# Patient Record
Sex: Male | Born: 1960 | Race: White | Hispanic: No | Marital: Single | Smoking: Current every day smoker
Health system: Southern US, Community
[De-identification: ages and names within clinical notes are randomized; demographics above are authoritative.]

## PROBLEM LIST (undated history)

## (undated) DIAGNOSIS — M1990 Unspecified osteoarthritis, unspecified site: Secondary | ICD-10-CM

## (undated) DIAGNOSIS — J449 Chronic obstructive pulmonary disease, unspecified: Secondary | ICD-10-CM

---

## 2018-08-02 ENCOUNTER — Encounter (HOSPITAL_COMMUNITY): Payer: Self-pay | Admitting: Emergency Medicine

## 2018-08-02 ENCOUNTER — Inpatient Hospital Stay (HOSPITAL_COMMUNITY)
Admission: EM | Admit: 2018-08-02 | Discharge: 2018-08-04 | DRG: 192 | Disposition: A | Discharge status: Home / Self Care | Payer: Medicare Other | Attending: Internal Medicine | Admitting: Internal Medicine

## 2018-08-02 ENCOUNTER — Other Ambulatory Visit: Payer: Self-pay

## 2018-08-02 ENCOUNTER — Emergency Department (HOSPITAL_COMMUNITY): Payer: Medicare Other

## 2018-08-02 DIAGNOSIS — Z8249 Family history of ischemic heart disease and other diseases of the circulatory system: Secondary | ICD-10-CM | POA: Diagnosis not present

## 2018-08-02 DIAGNOSIS — K219 Gastro-esophageal reflux disease without esophagitis: Secondary | ICD-10-CM | POA: Diagnosis present

## 2018-08-02 DIAGNOSIS — J440 Chronic obstructive pulmonary disease with acute lower respiratory infection: Secondary | ICD-10-CM | POA: Diagnosis present

## 2018-08-02 DIAGNOSIS — J209 Acute bronchitis, unspecified: Secondary | ICD-10-CM | POA: Diagnosis present

## 2018-08-02 DIAGNOSIS — J441 Chronic obstructive pulmonary disease with (acute) exacerbation: Principal | ICD-10-CM

## 2018-08-02 DIAGNOSIS — F17210 Nicotine dependence, cigarettes, uncomplicated: Secondary | ICD-10-CM | POA: Diagnosis present

## 2018-08-02 DIAGNOSIS — R0602 Shortness of breath: Secondary | ICD-10-CM | POA: Diagnosis present

## 2018-08-02 DIAGNOSIS — Z20828 Contact with and (suspected) exposure to other viral communicable diseases: Secondary | ICD-10-CM | POA: Diagnosis present

## 2018-08-02 DIAGNOSIS — R0601 Orthopnea: Secondary | ICD-10-CM | POA: Diagnosis present

## 2018-08-02 HISTORY — DX: Chronic obstructive pulmonary disease, unspecified: J44.9

## 2018-08-02 HISTORY — DX: Unspecified osteoarthritis, unspecified site: M19.90

## 2018-08-02 LAB — SARS CORONAVIRUS 2 BY RT PCR (HOSPITAL ORDER, PERFORMED IN ~~LOC~~ HOSPITAL LAB): SARS Coronavirus 2: NEGATIVE

## 2018-08-02 LAB — CREATININE, SERUM
Creatinine, Ser: 0.99 mg/dL (ref 0.61–1.24)
GFR calc Af Amer: >60 mL/min (ref >60)
GFR calc non Af Amer: >60 mL/min (ref >60)

## 2018-08-02 LAB — COMPREHENSIVE METABOLIC PANEL
ALT: 16 U/L (ref 0–44)
AST: 23 U/L (ref 15–41)
Albumin: 4.2 g/dL (ref 3.5–5.0)
Alkaline Phosphatase: 54 U/L (ref 38–126)
Anion gap: 11 (ref 5–15)
BUN: 18 mg/dL (ref 6–20)
CO2: 25 mmol/L (ref 22–32)
Calcium: 9.1 mg/dL (ref 8.9–10.3)
Chloride: 101 mmol/L (ref 98–111)
Creatinine, Ser: 1.15 mg/dL (ref 0.61–1.24)
GFR calc Af Amer: >60 mL/min (ref >60)
GFR calc non Af Amer: >60 mL/min (ref >60)
Glucose, Bld: 97 mg/dL (ref 70–99)
Potassium: 4.1 mmol/L (ref 3.5–5.1)
Sodium: 137 mmol/L (ref 135–145)
Total Bilirubin: 0.6 mg/dL (ref 0.3–1.2)
Total Protein: 6.7 g/dL (ref 6.5–8.1)

## 2018-08-02 LAB — CBC
HCT: 45.2 % (ref 39.0–52.0)
Hemoglobin: 15.3 g/dL (ref 13.0–17.0)
MCH: 31.4 pg (ref 26.0–34.0)
MCHC: 33.8 g/dL (ref 30.0–36.0)
MCV: 92.6 fL (ref 80.0–100.0)
Platelets: 239 10*3/uL (ref 150–400)
RBC: 4.88 MIL/uL (ref 4.22–5.81)
RDW: 12.8 % (ref 11.5–15.5)
WBC: 8.6 10*3/uL (ref 4.0–10.5)
nRBC: 0 % (ref 0.0–0.2)

## 2018-08-02 LAB — CBC WITH DIFFERENTIAL/PLATELET
Abs Immature Granulocytes: 0.05 10*3/uL (ref 0.00–0.07)
Basophils Absolute: 0.1 10*3/uL (ref 0.0–0.1)
Basophils Relative: 1 %
Eosinophils Absolute: 0.8 10*3/uL — ABNORMAL HIGH (ref 0.0–0.5)
Eosinophils Relative: 7 %
HCT: 48.8 % (ref 39.0–52.0)
Hemoglobin: 16.1 g/dL (ref 13.0–17.0)
Immature Granulocytes: 0 %
Lymphocytes Relative: 20 %
Lymphs Abs: 2.4 10*3/uL (ref 0.7–4.0)
MCH: 31.3 pg (ref 26.0–34.0)
MCHC: 33 g/dL (ref 30.0–36.0)
MCV: 94.9 fL (ref 80.0–100.0)
Monocytes Absolute: 0.9 10*3/uL (ref 0.1–1.0)
Monocytes Relative: 7 %
Neutro Abs: 7.7 10*3/uL (ref 1.7–7.7)
Neutrophils Relative %: 65 %
Platelets: 260 10*3/uL (ref 150–400)
RBC: 5.14 MIL/uL (ref 4.22–5.81)
RDW: 12.6 % (ref 11.5–15.5)
WBC: 12 10*3/uL — ABNORMAL HIGH (ref 4.0–10.5)
nRBC: 0 % (ref 0.0–0.2)

## 2018-08-02 LAB — D-DIMER, QUANTITATIVE: D-Dimer, Quant: <0.27 ug/mL-FEU (ref 0.00–0.50)

## 2018-08-02 LAB — C-REACTIVE PROTEIN: CRP: <0.8 mg/dL (ref <1.0)

## 2018-08-02 LAB — LACTATE DEHYDROGENASE: LDH: 198 U/L — ABNORMAL HIGH (ref 98–192)

## 2018-08-02 LAB — ABO/RH: ABO/RH(D): A POS

## 2018-08-02 LAB — HIV ANTIBODY (ROUTINE TESTING W REFLEX)
HIV Screen 4th Generation wRfx: NONREACTIVE
HIV Screen 4th Generation wRfx: NONREACTIVE

## 2018-08-02 LAB — FIBRINOGEN: Fibrinogen: 414 mg/dL (ref 210–475)

## 2018-08-02 MED ORDER — ENOXAPARIN SODIUM 40 MG/0.4ML ~~LOC~~ SOLN
40.0000 mg | Freq: Every day | SUBCUTANEOUS | Status: DC
  Administered 2018-08-02: 40 mg via SUBCUTANEOUS
  Filled 2018-08-02 (×3): qty 0.4

## 2018-08-02 MED ORDER — MAGNESIUM SULFATE 2 GM/50ML IV SOLN
2.0000 g | INTRAVENOUS | Status: AC
  Administered 2018-08-02: 03:00:00 2 g via INTRAVENOUS
  Filled 2018-08-02: qty 50

## 2018-08-02 MED ORDER — KETOROLAC TROMETHAMINE 30 MG/ML IJ SOLN
30.0000 mg | Freq: Once | INTRAMUSCULAR | Status: AC
  Administered 2018-08-02: 30 mg via INTRAVENOUS
  Filled 2018-08-02: qty 1

## 2018-08-02 MED ORDER — BENZONATATE 100 MG PO CAPS
100.0000 mg | ORAL_CAPSULE | Freq: Three times a day (TID) | ORAL | Status: DC
  Administered 2018-08-02 – 2018-08-04 (×7): 100 mg via ORAL
  Filled 2018-08-02 (×7): qty 1

## 2018-08-02 MED ORDER — IPRATROPIUM-ALBUTEROL 0.5-2.5 (3) MG/3ML IN SOLN
3.0000 mL | Freq: Four times a day (QID) | RESPIRATORY_TRACT | Status: DC
  Administered 2018-08-02 – 2018-08-03 (×6): 3 mL via RESPIRATORY_TRACT
  Filled 2018-08-02 (×6): qty 3

## 2018-08-02 MED ORDER — METHYLPREDNISOLONE SODIUM SUCC 125 MG IJ SOLR
INTRAMUSCULAR | Status: AC
  Filled 2018-08-02: qty 2

## 2018-08-02 MED ORDER — ALBUTEROL SULFATE (2.5 MG/3ML) 0.083% IN NEBU: 2.5000 mg | INHALATION_SOLUTION | RESPIRATORY_TRACT | Status: DC

## 2018-08-02 MED ORDER — AEROCHAMBER PLUS FLO-VU LARGE MISC
Status: AC
  Administered 2018-08-02: 02:00:00 1
  Filled 2018-08-02: qty 1

## 2018-08-02 MED ORDER — DM-GUAIFENESIN ER 30-600 MG PO TB12
1.0000 | ORAL_TABLET | Freq: Two times a day (BID) | ORAL | Status: DC
  Administered 2018-08-02 – 2018-08-04 (×5): 1 via ORAL
  Filled 2018-08-02 (×5): qty 1

## 2018-08-02 MED ORDER — ALBUTEROL SULFATE HFA 108 (90 BASE) MCG/ACT IN AERS
8.0000 | INHALATION_SPRAY | Freq: Once | RESPIRATORY_TRACT | Status: AC
  Administered 2018-08-02: 8 via RESPIRATORY_TRACT

## 2018-08-02 MED ORDER — IPRATROPIUM-ALBUTEROL 0.5-2.5 (3) MG/3ML IN SOLN
3.0000 mL | RESPIRATORY_TRACT | Status: DC
  Administered 2018-08-02: 3 mL via RESPIRATORY_TRACT
  Filled 2018-08-02: qty 3

## 2018-08-02 MED ORDER — METHYLPREDNISOLONE SODIUM SUCC 125 MG IJ SOLR
60.0000 mg | Freq: Three times a day (TID) | INTRAMUSCULAR | Status: DC
  Administered 2018-08-02 – 2018-08-04 (×6): 60 mg via INTRAVENOUS
  Filled 2018-08-02 (×6): qty 2

## 2018-08-02 MED ORDER — DOXYCYCLINE HYCLATE 100 MG PO TABS
100.0000 mg | ORAL_TABLET | Freq: Two times a day (BID) | ORAL | Status: DC
  Administered 2018-08-02 – 2018-08-04 (×5): 100 mg via ORAL
  Filled 2018-08-02 (×5): qty 1

## 2018-08-02 MED ORDER — ORAL CARE MOUTH RINSE
15.0000 mL | Freq: Two times a day (BID) | OROMUCOSAL | Status: DC
  Administered 2018-08-02 – 2018-08-04 (×5): 15 mL via OROMUCOSAL

## 2018-08-02 MED ORDER — METHYLPREDNISOLONE SODIUM SUCC 125 MG IJ SOLR
125.0000 mg | Freq: Once | INTRAMUSCULAR | Status: AC
  Administered 2018-08-02: 03:00:00 125 mg via INTRAVENOUS
  Filled 2018-08-02: qty 2

## 2018-08-02 MED ORDER — AEROCHAMBER PLUS FLO-VU LARGE MISC
1.0000 | Freq: Once | Status: AC
  Administered 2018-08-02: 1

## 2018-08-02 MED ORDER — ALBUTEROL SULFATE HFA 108 (90 BASE) MCG/ACT IN AERS
8.0000 | INHALATION_SPRAY | Freq: Once | RESPIRATORY_TRACT | Status: AC
  Administered 2018-08-02: 8 via RESPIRATORY_TRACT
  Filled 2018-08-02: qty 6.7

## 2018-08-02 MED ORDER — ALBUTEROL SULFATE HFA 108 (90 BASE) MCG/ACT IN AERS
2.0000 | INHALATION_SPRAY | RESPIRATORY_TRACT | Status: DC
  Administered 2018-08-02: 05:00:00 2 via RESPIRATORY_TRACT
  Filled 2018-08-02: qty 6.7

## 2018-08-02 MED ORDER — SODIUM CHLORIDE 0.9% FLUSH
3.0000 mL | Freq: Two times a day (BID) | INTRAVENOUS | Status: DC
  Administered 2018-08-02 – 2018-08-03 (×5): 3 mL via INTRAVENOUS

## 2018-08-02 MED ORDER — GUAIFENESIN ER 600 MG PO TB12: 600.0000 mg | ORAL_TABLET | Freq: Two times a day (BID) | ORAL | Status: DC

## 2018-08-02 MED ORDER — NICOTINE 14 MG/24HR TD PT24
14.0000 mg | MEDICATED_PATCH | Freq: Every day | TRANSDERMAL | Status: DC
  Administered 2018-08-02 – 2018-08-04 (×3): 14 mg via TRANSDERMAL
  Filled 2018-08-02 (×3): qty 1

## 2018-08-02 MED ORDER — IPRATROPIUM BROMIDE 0.02 % IN SOLN: 0.5000 mg | RESPIRATORY_TRACT | Status: DC

## 2018-08-02 NOTE — Progress Notes (Signed)
[facility] PLAN OF CARE NOTE Patient: Bill Bishop WOE:[id number]   PCP: Patient, No Pcp Per DOB: 1961/04/05   DOA: 08/02/2018   DOS: 08/02/2018    Patient was admitted by my colleague Dr. Joneen Roach earlier on 08/02/2018. I have reviewed the H&P as well as assessment and plan and agree with the same. Important changes in the plan are listed below.  Plan of care: Principal Problem:   COPD with acute exacerbation (HCC)    Continue nebulizer, addCough suppressants, continue steroids, add doxycycline.  Will likely require oxygen on discharge.  Author: Lynden Oxford, MD [facility] 08/02/2018 7:09 PM   If 7PM-7AM, please contact night-coverage at [email or web address]

## 2018-08-02 NOTE — ED Notes (Signed)
ED TO INPATIENT HANDOFF REPORT  ED Nurse Name and Phone #:  Minus Libertysbeyde "Fredric MareBailey" RN [phone]  S Name/Age/Gender Bill Bishop 58 y.o. male Room/Bed: 016C/016C  Code Status   Code Status: Not on file  Home/SNF/Other Home Patient oriented to: self, place, time and situation Is this baseline? Yes      Chief Complaint Shortness of breath, wheezing  Triage Note Pt with 3 day Hx of SOB progresssively getting worse.  Pt arrives with audible inspiratory and expiratory wheezing. Pt states his albuterol inhaller was not sufficient to stop wheezing.   Allergies No Known Allergies  Level of Care/Admitting Diagnosis ED Disposition    None      B Medical/Surgery History No past medical history on file.    A IV Location/Drains/Wounds Patient Lines/Drains/Airways Status   Active Line/Drains/Airways    Name:   Placement date:   Placement time:   Site:   Days:   Peripheral IV 08/02/18 Right Antecubital   08/02/18    0251    Antecubital   less than 1          Intake/Output Last 24 hours No intake or output data in the 24 hours ending 08/02/18 0443  Labs/Imaging Results for orders placed or performed during the hospital encounter of 08/02/18 (from the past 48 hour(s))  CBC with Differential/Platelet     Status: Abnormal   Collection Time: 08/02/18  1:56 AM  Result Value Ref Range   WBC 12.0 (H) 4.0 - 10.5 K/uL   RBC 5.14 4.22 - 5.81 MIL/uL   Hemoglobin 16.1 13.0 - 17.0 g/dL   HCT 95.248.8 84.139.0 - 32.452.0 %   MCV 94.9 80.0 - 100.0 fL   MCH 31.3 26.0 - 34.0 pg   MCHC 33.0 30.0 - 36.0 g/dL   RDW 40.112.6 02.711.5 - 25.315.5 %   Platelets 260 150 - 400 K/uL   nRBC 0.0 0.0 - 0.2 %   Neutrophils Relative % 65 %   Neutro Abs 7.7 1.7 - 7.7 K/uL   Lymphocytes Relative 20 %   Lymphs Abs 2.4 0.7 - 4.0 K/uL   Monocytes Relative 7 %   Monocytes Absolute 0.9 0.1 - 1.0 K/uL   Eosinophils Relative 7 %   Eosinophils Absolute 0.8 (H) 0.0 - 0.5 K/uL   Basophils Relative 1 %   Basophils Absolute 0.1  0.0 - 0.1 K/uL   Immature Granulocytes 0 %   Abs Immature Granulocytes 0.05 0.00 - 0.07 K/uL    Comment: Performed at [facility], [address] [place], [place] [id number]  Comprehensive metabolic panel     Status: None   Collection Time: 08/02/18  1:56 AM  Result Value Ref Range   Sodium 137 135 - 145 mmol/L   Potassium 4.1 3.5 - 5.1 mmol/L   Chloride 101 98 - 111 mmol/L   CO2 25 22 - 32 mmol/L   Glucose, Bld 97 70 - 99 mg/dL   BUN 18 6 - 20 mg/dL   Creatinine, Ser 3.471.15 0.61 - 1.24 mg/dL   Calcium 9.1 8.9 - 42.510.3 mg/dL   Total Protein 6.7 6.5 - 8.1 g/dL   Albumin 4.2 3.5 - 5.0 g/dL   AST 23 15 - 41 U/L   ALT 16 0 - 44 U/L   Alkaline Phosphatase 54 38 - 126 U/L   Total Bilirubin 0.6 0.3 - 1.2 mg/dL   GFR calc non Af Amer >60 >60 mL/min   GFR calc Af Amer >60 >60 mL/min  Anion gap 11 5 - 15    Comment: Performed at [facility], [address], [place], [place] [address]  Lactate dehydrogenase     Status: Abnormal   Collection Time: 08/02/18  1:56 AM  Result Value Ref Range   LDH 198 (H) 98 - 192 U/L    Comment: Performed at [facility], [address] [place], [place] [address]  D-dimer, quantitative (not at [facility])     Status: None   Collection Time: 08/02/18  1:56 AM  Result Value Ref Range   D-Dimer, Quant <0.27 0.00 - 0.50 ug/mL-FEU    Comment: (NOTE) At the manufacturer cut-off of 0.50 ug/mL FEU, this assay has been documented to exclude PE with a sensitivity and negative predictive value of 97 to 99%.  At this time, this assay has not been approved by the FDA to exclude DVT/VTE. Results should be correlated with clinical presentation. Performed at [facility], [address] [place], [place] [address]   Fibrinogen     Status: None   Collection Time: 08/02/18  1:56 AM  Result Value Ref Range   Fibrinogen 414 210 - 475 mg/dL    Comment: Performed at [facility], [address] [place], [place] [address]  ABO/Rh     Status: None    Collection Time: 08/02/18  2:06 AM  Result Value Ref Range   ABO/RH(D)      A POS Performed at [facility], [address] [place], [place] [address]   SARS Coronavirus 2 [facility] order, Performed in [facility] hospital lab)     Status: None   Collection Time: 08/02/18  2:32 AM  Result Value Ref Range   SARS Coronavirus 2 NEGATIVE NEGATIVE    Comment: (NOTE) If result is NEGATIVE SARS-CoV-2 target nucleic acids are NOT DETECTED. The SARS-CoV-2 RNA is generally detectable in upper and lower  respiratory specimens during the acute phase of infection. The lowest  concentration of SARS-CoV-2 viral copies this assay can detect is 250  copies / mL. A negative result does not preclude SARS-CoV-2 infection  and should not be used as the sole basis for treatment or other  patient management decisions.  A negative result may occur with  improper specimen collection / handling, submission of specimen other  than nasopharyngeal swab, presence of viral mutation(s) within the  areas targeted by this assay, and inadequate number of viral copies  (<250 copies / mL). A negative result must be combined with clinical  observations, patient history, and epidemiological information. If result is POSITIVE SARS-CoV-2 target nucleic acids are DETECTED. The SARS-CoV-2 RNA is generally detectable in upper and lower  respiratory specimens dur ing the acute phase of infection.  Positive  results are indicative of active infection with SARS-CoV-2.  Clinical  correlation with patient history and other diagnostic information is  necessary to determine patient infection status.  Positive results do  not rule out bacterial infection or co-infection with other viruses. If result is PRESUMPTIVE POSTIVE SARS-CoV-2 nucleic acids MAY BE PRESENT.   A presumptive positive result was obtained on the submitted specimen  and confirmed on repeat testing.  While 2019 novel coronavirus  (SARS-CoV-2) nucleic  acids may be present in the submitted sample  additional confirmatory testing may be necessary for epidemiological  and / or clinical management purposes  to differentiate between  SARS-CoV-2 and other Sarbecovirus currently known to infect humans.  If clinically indicated additional testing with  an alternate test  methodology [phone]) is advised. The SARS-CoV-2 RNA is generally  detectable in upper and lower respiratory sp ecimens during the acute  phase of infection. The expected result is Negative. Fact Sheet for Patients:  BoilerBrush.com.cy Fact Sheet for Healthcare Providers: https://pope.com/ This test is not yet approved or cleared by the [place] FDA and has been authorized for detection and/or diagnosis of SARS-CoV-2 by FDA under an Emergency Use Authorization (EUA).  This EUA will remain in effect (meaning this test can be used) for the duration of the COVID-19 declaration under Section 564(b)(1) of the Act, 21 U.S.C. section 360bbb-3(b)(1), unless the authorization is terminated or revoked sooner. Performed at [facility], [address], [place], [place] [address]    Dg Chest Port 1 View  Result Date: 08/02/2018 CLINICAL DATA:  Shortness of breath, cough, wheezing EXAM: PORTABLE CHEST 1 VIEW COMPARISON:  None. FINDINGS: Heart and mediastinal contours are within normal limits. No focal opacities or effusions. No acute bony abnormality. IMPRESSION: No active disease. Electronically Signed   By: Charlett Nose M.D.   On: 08/02/2018 02:20    Pending Labs Unresulted Labs (From admission, onward)    Start     Ordered   08/02/18 0150  C-reactive protein  Once,   R     08/02/18 0150   08/02/18 0148  HIV antibody (Routine Testing)  Once,   R     08/02/18 0150          Vitals/Pain Today's Vitals   08/02/18 0338 08/02/18 0345 08/02/18 0400 08/02/18 0431  BP:  133/72 136/84   Pulse:  70 64   Resp:   (!) 26    Temp:      TempSrc:      SpO2:  96% 95%   Weight:      Height:      PainSc: 8    8     Isolation Precautions Airborne and Contact precautions  Medications Medications  sodium chloride flush (NS) 0.9 % injection 3 mL (has no administration in time range)  albuterol (VENTOLIN HFA) 108 (90 Base) MCG/ACT inhaler 2 puff (has no administration in time range)  albuterol (VENTOLIN HFA) 108 (90 Base) MCG/ACT inhaler 8 puff (8 puffs Inhalation Given 08/02/18 0226)  AeroChamber Plus Flo-Vu Large MISC 1 each (1 each Other Given 08/02/18 0312)  AeroChamber Plus Flo-Vu Large MISC (1 each  Given 08/02/18 0227)  methylPREDNISolone sodium succinate (SOLU-MEDROL) 125 mg/2 mL injection 125 mg (125 mg Intravenous Not Given 08/02/18 0316)  magnesium sulfate IVPB 2 g 50 mL (0 g Intravenous Stopped 08/02/18 0436)  albuterol (VENTOLIN HFA) 108 (90 Base) MCG/ACT inhaler 8 puff (8 puffs Inhalation Given 08/02/18 0307)    Mobility walks Low fall risk   Focused Assessments Pulmonary Assessment Handoff:  Lung sounds: Bilateral Breath Sounds: Expiratory wheezes, Inspiratory wheezes L Breath Sounds: Expiratory wheezes, Inspiratory wheezes R Breath Sounds: Expiratory wheezes, Inspiratory wheezes O2 Device: Room Air        R Recommendations: See Admitting Provider Note  Report given to:   Additional Notes:  Pt. C/o SOB at rest worsening with exertion. Pt. States pain of 8 on scale 0-10 when breathing.  Pt. On 2 L O2 nasal cannula. Expiratory wheezing

## 2018-08-02 NOTE — ED Triage Notes (Signed)
Pt with 3 day Hx of SOB progresssively getting worse.  Pt arrives with audible inspiratory and expiratory wheezing. Pt states his albuterol inhaller was not sufficient to stop wheezing.

## 2018-08-02 NOTE — ED Provider Notes (Signed)
[facility] EMERGENCY DEPARTMENT Provider Note   CSN: [id number] Arrival date & time: 08/02/18  0124    History   Chief Complaint Chief Complaint  Patient presents with  . Shortness of Breath    HPI Bill Bishop is a 58 y.o. male.     Patient presents to the emergency department with a chief complaint of shortness of breath and wheezing and productive cough.  States his symptoms started 3 days ago.  He denies any known exposures to anyone with COVID-19.  He denies any fevers or chills.  He reports a history of COPD.  He has been using his inhaler with no relief.  He denies any chest pain.  Denies any lower extremity swelling.  Denies any additional symptoms.  The history is provided by the patient. No language interpreter was used.    No past medical history on file.  There are no active problems to display for this patient.   History reviewed. No pertinent surgical history.      Home Medications    Prior to Admission medications   Not on File    Family History No family history on file.  Social History Social History   Tobacco Use  . Smoking status: Not on file  Substance Use Topics  . Alcohol use: Not on file  . Drug use: Not on file     Allergies   Patient has no known allergies.   Review of Systems Review of Systems  All other systems reviewed and are negative.    Physical Exam Updated Vital Signs BP (!) 146/86 (BP Location: Right Arm)   Pulse 66   Temp 98.2 F (36.8 C) (Oral)   Resp (!) 21   Ht 5\' 8"  (1.727 m)   Wt 77.1 kg   SpO2 94%   BMI 25.85 kg/m   Physical Exam Vitals signs and nursing note reviewed.  Constitutional:      Appearance: He is well-developed.  HENT:     Head: Normocephalic and atraumatic.  Eyes:     Conjunctiva/sclera: Conjunctivae normal.  Neck:     Musculoskeletal: Neck supple.  Cardiovascular:     Rate and Rhythm: Normal rate and regular rhythm.     Heart sounds: No murmur.   Pulmonary:     Effort: No respiratory distress.     Breath sounds: Wheezing present.     Comments: Diffuse wheezes bilaterally Increased work of breathing Abdominal:     Palpations: Abdomen is soft.     Tenderness: There is no abdominal tenderness.  Skin:    General: Skin is warm and dry.  Neurological:     Mental Status: He is alert.      ED Treatments / Results  Labs (all labs ordered are listed, but only abnormal results are displayed) Labs Reviewed  SARS CORONAVIRUS 2 (HOSPITAL ORDER, PERFORMED IN [facility])  HIV ANTIBODY (ROUTINE TESTING W REFLEX)  CBC WITH DIFFERENTIAL/PLATELET  COMPREHENSIVE METABOLIC PANEL  LACTATE DEHYDROGENASE  D-DIMER, QUANTITATIVE (NOT AT [facility])  C-REACTIVE PROTEIN  FIBRINOGEN  ABO/RH    EKG None  Radiology No results found.  Procedures Procedures (including critical care time) CRITICAL CARE Performed by: Roxy Horseman Multiple albuterol treatments, IV solumedrol, IV mag, respiratory distress  Total critical care time: 44 minutes  Critical care time was exclusive of separately billable procedures and treating other patients.  Critical care was necessary to treat or prevent imminent or life-threatening deterioration.  Critical care was time spent personally by  me on the following activities: development of treatment plan with patient and/or surrogate as well as nursing, discussions with consultants, evaluation of patient's response to treatment, examination of patient, obtaining history from patient or surrogate, ordering and performing treatments and interventions, ordering and review of laboratory studies, ordering and review of radiographic studies, pulse oximetry and re-evaluation of patient's condition.  Medications Ordered in ED Medications  sodium chloride flush (NS) 0.9 % injection 3 mL (has no administration in time range)  albuterol (VENTOLIN HFA) 108 (90 Base) MCG/ACT inhaler 8 puff (has no administration in  time range)  AeroChamber Plus Flo-Vu Large MISC 1 each (has no administration in time range)     Initial Impression / Assessment and Plan / ED Course  I have reviewed the triage vital signs and the nursing notes.  Pertinent labs & imaging results that were available during my care of the patient were reviewed by me and considered in my medical decision making (see chart for details).        Patient with presumed COPD exacerbation.  He has a history of the same.  States that he has been having difficulty breathing and wheezing for the past several days.  He denies any fever or chills.  He states that he has had some productive cough.  He denies any known exposures to anyone with COVID-19.  Chest x-ray is clear.  Show mild leukocytosis.  Patient given 2 separate dose of MDI albuterol equaling 7.2mg  per dose, with little improvement.  Patient still sounds very wheezy.  He ambulates dropping his O2 saturation to 90% while on 2 L oxygen.  He does not wear oxygen at home.  COVID-19 test negative.  Patient seen by discussed with Dr. Manus Gunningancour, who recommends admission.  Appreciate Dr. Joneen Roachrosley for admitting.  Final Clinical Impressions(s) / ED Diagnoses   Final diagnoses:  COPD exacerbation [facility](HCC)    ED Discharge Orders    None       Roxy HorsemanBrowning, Tyse Auriemma, PA-C 08/02/18 0539    Glynn Octaveancour, Stephen, MD 08/02/18 [phone]

## 2018-08-02 NOTE — ED Notes (Signed)
Pt. on 2 L O2 Nasal canula, not on oxygen at home, with increase respiratory rate 25 and auditory wheezing.   While at rest attempted to wean pt. Off of O2. Pt. Weaned to 1 L with SpO2 at 96%. While at rest and on room air pt. SpO2 90% with increased respiratory effort.   Pt. Ambulated in room and down hall pt de-sat 90% on 1 L nasal canula. Increased oxygen to 2 L while ambulating increased SpO2 at 94-95% with increased use of accessory muscles and increased breathing.

## 2018-08-02 NOTE — H&P (Signed)
PCP:  None  Chief Complaint:  Shortness of breath  HPI: This is a 58 year old male who presents with complaint of wheezing, coughing and smothering for the last 3 days. He is hurting in  the lungs with coughing and deep breaths. He denies any fevers or chills. He reports a headache. His coughs productive of a grayish sputum  In  The ER he was r/o for Covid. He has been socially isolating. When ambulated patient sating 90% on 2L.  He smokes 1PPD. Not on oxygen.  History provided by the patient  Review of Systems:  The patient denies anorexia, fever, weight loss,, vision loss, decreased hearing, hoarseness, sob, wheezing, chest pain, syncope, dyspnea on exertion, peripheral edema, balance deficits, hemoptysis, abdominal pain, melena, hematochezia, severe indigestion/heartburn, hematuria, incontinence, genital sores, muscle weakness, suspicious skin lesions, transient blindness, difficulty walking, depression, unusual weight change, abnormal bleeding, enlarged lymph nodes, angioedema, and breast masses.  Past Medical History: None  Past surgical history: None  Medications: Prior to Admission medications   None    Allergies:  No Known Allergies  Social History: Positive for tobacco, 1 pack/day.  Negative for file for, alcohol or illicits drug.  Family History: HTN  Physical Exam: Vitals:   08/02/18 0345 08/02/18 0400 08/02/18 0445 08/02/18 0509  BP: 133/72 136/84  138/90  Pulse: 70 64 70 76  Resp:  (!) 26 (!) 25 15  Temp:      TempSrc:      SpO2: 96% 95% 97% 98%  Weight:      Height:        General:  Alert and oriented times three, well developed and nourished, no acute distress Eyes: PERRLA, pink conjunctiva, no scleral icterus ENT: Moist oral mucosa, neck supple, no thyromegaly Lungs: clear to ascultation, wheeze, no crackles, no use of accessory muscles Cardiovascular: regular rate and rhythm, no regurgitation, no gallops, no murmurs. No carotid bruits, no  JVD Abdomen: soft, positive BS, non-tender, non-distended, no organomegaly, not an acute abdomen GU: not examined Neuro: CN II - XII grossly intact, sensation intact Musculoskeletal: strength 5/5 all extremities, no clubbing, cyanosis or edema Skin: no rash, no subcutaneous crepitation, no decubitus Psych: appropriate patient   Labs on Admission:  Recent Labs    08/02/18 0156  NA 137  K 4.1  CL 101  CO2 25  GLUCOSE 97  BUN 18  CREATININE 1.15  CALCIUM 9.1   Recent Labs    08/02/18 0156  AST 23  ALT 16  ALKPHOS 54  BILITOT 0.6  PROT 6.7  ALBUMIN 4.2   No results for input(s): LIPASE, AMYLASE in the last 72 hours. Recent Labs    08/02/18 0156  WBC 12.0*  NEUTROABS 7.7  HGB 16.1  HCT 48.8  MCV 94.9  PLT 260   No results for input(s): CKTOTAL, CKMB, CKMBINDEX, TROPONINI in the last 72 hours. Invalid input(s): POCBNP Recent Labs    08/02/18 0156  DDIMER <0.27   No results for input(s): HGBA1C in the last 72 hours. No results for input(s): CHOL, HDL, LDLCALC, TRIG, CHOLHDL, LDLDIRECT in the last 72 hours. No results for input(s): TSH, T4TOTAL, T3FREE, THYROIDAB in the last 72 hours.  Invalid input(s): FREET3 No results for input(s): VITAMINB12, FOLATE, FERRITIN, TIBC, IRON, RETICCTPCT in the last 72 hours.  Micro Results: Recent Results (from the past 240 hour(s))  SARS Coronavirus 2 [facility](Hospital order, Performed in [facility] hospital lab)     Status: None   Collection Time: 08/02/18  2:32 AM  Result Value Ref Range Status   SARS Coronavirus 2 NEGATIVE NEGATIVE Final    Comment: (NOTE) If result is NEGATIVE SARS-CoV-2 target nucleic acids are NOT DETECTED. The SARS-CoV-2 RNA is generally detectable in upper and lower  respiratory specimens during the acute phase of infection. The lowest  concentration of SARS-CoV-2 viral copies this assay can detect is 250  copies / mL. A negative result does not preclude SARS-CoV-2 infection  and should not be used as  the sole basis for treatment or other  patient management decisions.  A negative result may occur with  improper specimen collection / handling, submission of specimen other  than nasopharyngeal swab, presence of viral mutation(s) within the  areas targeted by this assay, and inadequate number of viral copies  (<250 copies / mL). A negative result must be combined with clinical  observations, patient history, and epidemiological information. If result is POSITIVE SARS-CoV-2 target nucleic acids are DETECTED. The SARS-CoV-2 RNA is generally detectable in upper and lower  respiratory specimens dur ing the acute phase of infection.  Positive  results are indicative of active infection with SARS-CoV-2.  Clinical  correlation with patient history and other diagnostic information is  necessary to determine patient infection status.  Positive results do  not rule out bacterial infection or co-infection with other viruses. If result is PRESUMPTIVE POSTIVE SARS-CoV-2 nucleic acids MAY BE PRESENT.   A presumptive positive result was obtained on the submitted specimen  and confirmed on repeat testing.  While 2019 novel coronavirus  (SARS-CoV-2) nucleic acids may be present in the submitted sample  additional confirmatory testing may be necessary for epidemiological  and / or clinical management purposes  to differentiate between  SARS-CoV-2 and other Sarbecovirus currently known to infect humans.  If clinically indicated additional testing with an alternate test  methodology [phone]) is advised. The SARS-CoV-2 RNA is generally  detectable in upper and lower respiratory sp ecimens during the acute  phase of infection. The expected result is Negative. Fact Sheet for Patients:  BoilerBrush.com.cy Fact Sheet for Healthcare Providers: https://pope.com/ This test is not yet approved or cleared by the [place] FDA and has been authorized for  detection and/or diagnosis of SARS-CoV-2 by FDA under an Emergency Use Authorization (EUA).  This EUA will remain in effect (meaning this test can be used) for the duration of the COVID-19 declaration under Section 564(b)(1) of the Act, 21 U.S.C. section 360bbb-3(b)(1), unless the authorization is terminated or revoked sooner. Performed at [facility], [address], [place], [place] [address]      Radiological Exams on Admission: Dg Chest Port 1 View  Result Date: 08/02/2018 CLINICAL DATA:  Shortness of breath, cough, wheezing EXAM: PORTABLE CHEST 1 VIEW COMPARISON:  None. FINDINGS: Heart and mediastinal contours are within normal limits. No focal opacities or effusions. No acute bony abnormality. IMPRESSION: No active disease. Electronically Signed   By: Charlett Nose M.D.   On: 08/02/2018 02:20    Assessment/Plan Present on Admission: . COPD with acute exacerbation (HCC) -Admit to MedSurg -Solu-Medrol 60 mg IV every 8 -Oxygen to keep sats greater than 88% -Respiratory to evaluate and treat -Patient is COVID negative  Tobacco use -Nicotine patch  Frances Ambrosino 08/02/2018, 5:27 AM

## 2018-08-03 ENCOUNTER — Encounter (HOSPITAL_COMMUNITY): Payer: Self-pay | Admitting: *Deleted

## 2018-08-03 MED ORDER — FAMOTIDINE 20 MG PO TABS
20.0000 mg | ORAL_TABLET | Freq: Every day | ORAL | Status: DC
  Administered 2018-08-03 – 2018-08-04 (×2): 20 mg via ORAL
  Filled 2018-08-03 (×2): qty 1

## 2018-08-03 MED ORDER — IPRATROPIUM-ALBUTEROL 0.5-2.5 (3) MG/3ML IN SOLN
3.0000 mL | Freq: Three times a day (TID) | RESPIRATORY_TRACT | Status: DC
  Administered 2018-08-03 – 2018-08-04 (×3): 3 mL via RESPIRATORY_TRACT
  Filled 2018-08-03 (×3): qty 3

## 2018-08-03 MED ORDER — ALUM & MAG HYDROXIDE-SIMETH 200-200-20 MG/5ML PO SUSP: 15.0000 mL | ORAL | Status: DC | PRN

## 2018-08-03 MED ORDER — TRAMADOL HCL 50 MG PO TABS
50.0000 mg | ORAL_TABLET | Freq: Once | ORAL | Status: AC
  Administered 2018-08-03: 50 mg via ORAL
  Filled 2018-08-03: qty 1

## 2018-08-03 NOTE — Progress Notes (Signed)
   08/02/18 2024  Provider Notification  Provider Name/Title Blount NP  Date Provider Notified 08/02/18  Time Provider Notified 2024  Notification Type Page  Notification Reason Requested by patient/family (c/o achiness/soreness rt chest 2nd coughing)  Response See new orders  Date of Provider Response 08/02/18   Patient c/o achiness/soreness to right chest and back secondary to excessive coughing.  [facility] made aware.  Order received for IV Toradol which was given at 2151.  U[pon reassessment, pain is now 3/10 versus a 7/10.  Will continue to monitor patient.  Bernie Covey RN

## 2018-08-03 NOTE — Progress Notes (Signed)
[facility] Progress Note  Patient: Bill Bishop ZOX:[id number]   PCP: Patient, No Pcp Per DOB: 03/23/1961   DOA: 08/02/2018   DOS: 08/03/2018   Date of Service: the patient was seen and examined on 08/03/2018  Brief hospital course: Pt. with PMH of COPD; admitted on 08/02/2018, presented with complaint of cough and shortness of breath, was found to have COPD exacerbation. Currently further plan is continue current care.  Subjective: Continues to have coughing but there is no shortness of breath.  Unable to sleep last night.  No nausea no vomiting.  Concerned that his oxygenation drops at night.  Assessment and Plan: 1.  COPD exacerbation. Secondary to acute bronchitis. Continue antibiotics continue steroids, continue duo nebs. Continue supportive measures as well. Home oxygen evaluation suggested the patient is able to maintain adequate saturation on room air. Should be able to go home tomorrow if his wheezing continues to improve.  2.  Reported orthopnea without PND. Headaches. Suspect this could be seen in sleep apnea. We will check nocturnal oximetry. Outpatient sleep study recommended.  3.  GERD. Pepcid added.  4.  Active smoker. Recommended to stop smoking. Nicotine patch.  Diet: Regular diet DVT Prophylaxis: subcutaneous Heparin  Advance goals of care discussion: Full code  Family Communication: no family was present at bedside, at the time of interview.   Disposition:  Discharge to home tomorrow.  Consultants: None Procedures: None  Scheduled Meds: . benzonatate  100 mg Oral TID  . dextromethorphan-guaiFENesin  1 tablet Oral BID  . doxycycline  100 mg Oral Q12H  . enoxaparin (LOVENOX) injection  40 mg Subcutaneous Daily  . famotidine  20 mg Oral Daily  . ipratropium-albuterol  3 mL Nebulization TID  . mouth rinse  15 mL Mouth Rinse BID  . methylPREDNISolone (SOLU-MEDROL) injection  60 mg Intravenous Q8H  . nicotine  14 mg Transdermal Daily  . sodium  chloride flush  3 mL Intravenous Q12H   Continuous Infusions: PRN Meds: alum & mag hydroxide-simeth Antibiotics: Anti-infectives (From admission, onward)   Start     Dose/Rate Route Frequency Ordered Stop   08/02/18 1000  doxycycline (VIBRA-TABS) tablet 100 mg     100 mg Oral Every 12 hours 08/02/18 0930         Objective: Physical Exam: Vitals:   08/03/18 0942 08/03/18 1120 08/03/18 1511 08/03/18 1815  BP: 105/68   136/76  Pulse: 77  72 73  Resp: 16 16 16 18   Temp: 98 F (36.7 C)   98 F (36.7 C)  TempSrc: Oral   Oral  SpO2: 97%  94% 95%  Weight:      Height:        Intake/Output Summary (Last 24 hours) at 08/03/2018 2013 Last data filed at 08/03/2018 1817 Gross per 24 hour  Intake 2820 ml  Output 0 ml  Net 2820 ml   Filed Weights   08/02/18 0144 08/02/18 0630 08/02/18 2058  Weight: 77.1 kg 77.5 kg 77.5 kg   General: Alert, Awake and Oriented to Time, Place and Person. Appear in mild distress, affect appropriate Eyes: PERRL, Conjunctiva normal ENT: Oral Mucosa clear moist. Neck: no JVD, no Abnormal Mass Or lumps Cardiovascular: S1 and S2 Present, no Murmur, Peripheral Pulses Present Respiratory: normal respiratory effort, Bilateral Air entry equal and Decreased, no use of accessory muscle, no Crackles, bilateral wheezes Abdomen: Bowel Sound present, Soft and no tenderness, no hernia Skin: no redness, no Rash, no induration Extremities: no Pedal edema, no calf tenderness Neurologic:  Grossly no focal neuro deficit. Bilaterally Equal motor strength  Data Reviewed: CBC: Recent Labs  Lab 08/02/18 0156 08/02/18 0709  WBC 12.0* 8.6  NEUTROABS 7.7  --   HGB 16.1 15.3  HCT 48.8 45.2  MCV 94.9 92.6  PLT 260 239   Basic Metabolic Panel: Recent Labs  Lab 08/02/18 0156 08/02/18 0709  NA 137  --   K 4.1  --   CL 101  --   CO2 25  --   GLUCOSE 97  --   BUN 18  --   CREATININE 1.15 0.99  CALCIUM 9.1  --     Liver Function Tests: Recent Labs  Lab 08/02/18  0156  AST 23  ALT 16  ALKPHOS 54  BILITOT 0.6  PROT 6.7  ALBUMIN 4.2   No results for input(s): LIPASE, AMYLASE in the last 168 hours. No results for input(s): AMMONIA in the last 168 hours. Coagulation Profile: No results for input(s): INR, PROTIME in the last 168 hours. Cardiac Enzymes: No results for input(s): CKTOTAL, CKMB, CKMBINDEX, TROPONINI in the last 168 hours. BNP (last 3 results) No results for input(s): PROBNP in the last 8760 hours. CBG: No results for input(s): GLUCAP in the last 168 hours. Studies: No results found.   Time spent: 35 minutes  Author: Lynden Oxford, MD [facility] 08/03/2018 8:13 PM  To reach On-call, see care teams to locate the attending and reach out to them via [email or web address]. If 7PM-7AM, please contact night-coverage If you still have difficulty reaching the attending provider, please page the [facility] (Director on Call) for [facility] on amion for assistance.

## 2018-08-03 NOTE — Progress Notes (Signed)
Pt placed on overnight Pulse ox at 2130.  Pt currently on room air and tolerating well at this time, RT to monitor and assess as needed.

## 2018-08-03 NOTE — Progress Notes (Signed)
SATURATION QUALIFICATIONS: (This note is used to comply with regulatory documentation for home oxygen)  Patient Saturations on Room Air at Rest = 94%  Patient Saturations on Room Air while Ambulating = 91%  Patient Saturations on 0 Liters of oxygen while Ambulating = 91%  Please briefly explain why patient needs home oxygen:Pt does not need O2 as he ambulated in the hall and the lowest O2 saturation was 91 while ambulating on RA. MD Patel aware.   Leonia Reeves, RN

## 2018-08-04 ENCOUNTER — Encounter (HOSPITAL_COMMUNITY): Payer: Self-pay

## 2018-08-04 MED ORDER — BENZONATATE 100 MG PO CAPS: 100.0000 mg | ORAL_CAPSULE | Freq: Three times a day (TID) | ORAL | 0 refills | Status: DC

## 2018-08-04 MED ORDER — PREDNISONE 10 MG PO TABS: ORAL_TABLET | ORAL | 0 refills | Status: DC

## 2018-08-04 MED ORDER — DOXYCYCLINE HYCLATE 100 MG PO TABS: 100.0000 mg | ORAL_TABLET | Freq: Two times a day (BID) | ORAL | 0 refills | Status: AC

## 2018-08-04 MED ORDER — NICOTINE 14 MG/24HR TD PT24: 14.0000 mg | MEDICATED_PATCH | Freq: Every day | TRANSDERMAL | 0 refills | Status: DC

## 2018-08-04 MED ORDER — DM-GUAIFENESIN ER 30-600 MG PO TB12: 1.0000 | ORAL_TABLET | Freq: Two times a day (BID) | ORAL | 0 refills | Status: AC

## 2018-08-04 MED ORDER — ALBUTEROL SULFATE HFA 108 (90 BASE) MCG/ACT IN AERS: 2.0000 | INHALATION_SPRAY | Freq: Four times a day (QID) | RESPIRATORY_TRACT | 0 refills | Status: DC | PRN

## 2018-08-04 MED ORDER — TIOTROPIUM BROMIDE MONOHYDRATE 18 MCG IN CAPS: 18.0000 ug | ORAL_CAPSULE | Freq: Every day | RESPIRATORY_TRACT | 0 refills | Status: DC

## 2018-08-04 MED ORDER — BUDESONIDE-FORMOTEROL FUMARATE 80-4.5 MCG/ACT IN AERO: 2.0000 | INHALATION_SPRAY | Freq: Two times a day (BID) | RESPIRATORY_TRACT | 0 refills | Status: DC

## 2018-08-04 MED ORDER — IPRATROPIUM-ALBUTEROL 0.5-2.5 (3) MG/3ML IN SOLN: 3.0000 mL | Freq: Four times a day (QID) | RESPIRATORY_TRACT | 0 refills | Status: DC | PRN

## 2018-08-04 NOTE — Progress Notes (Signed)
DISCHARGE NOTE HOME Coreon Weinschenk to be discharged Home per MD order. Discussed prescriptions and follow up appointments with the patient. Prescriptions given to patient; medication list explained in detail. Patient verbalized understanding.  Skin clean, dry and intact without evidence of skin break down, no evidence of skin tears noted. IV catheter discontinued intact. Site without signs and symptoms of complications. Dressing and pressure applied. Pt denies pain at the site currently. No complaints noted.  Patient free of lines, drains, and wounds.   An After Visit Summary (AVS) was printed and given to the patient. Patient escorted via wheelchair, and discharged home via private auto.   Leonia Reeves, RN

## 2018-08-04 NOTE — Progress Notes (Signed)
Overnight Pulse Ox study completed and placed in Pt chart.  RT to monitor and assess as needed.

## 2018-08-05 NOTE — Discharge Summary (Signed)
[facility] Discharge Summary   Patient: Bill Bishop VOZ:[id number]   PCP: Patient, No Pcp Per DOB: [birth year]   Date of admission: 08/02/2018   Date of discharge: 08/04/2018     Discharge Diagnoses:   Principal Problem:   COPD with acute exacerbation (HCC)   Admitted From: hpme Disposition:  hpme  Recommendations for Outpatient Follow-up:  1. Please follow up with PCP in 1 week   Follow-up Information    PCP. Schedule an appointment as soon as possible for a visit in 1 week(s).          Diet recommendation: cardiac diet  Activity: The patient is advised to avoid heavy lifting until better.  Discharge Condition: good  Code Status: full code  History of present illness: As per the H and P dictated on admission, "This is a 58 year old male who presents with complaint of wheezing, coughing and smothering for the last 3 days. He is hurting in  the lungs with coughing and deep breaths. He denies any fevers or chills. He reports a headache. His coughs productive of a grayish sputum  In  The ER he was r/o for Covid. He has been socially isolating. When ambulated patient sating 90% on 2L.  He smokes 1PPD. Not on oxygen.  History provided by the patient"  Hospital Course:  Summary of his active problems in the hospital is as following. 1.  COPD exacerbation. Secondary to acute bronchitis. Continue antibiotics continue steroids, continue duo nebs. Continue supportive measures as well. Home oxygen evaluation suggested the patient is able to maintain adequate saturation on room air. Should be able to go home tomorrow if his wheezing continues to improve.  2.  Reported orthopnea without PND. Headaches. Suspect this could be seen in sleep apnea. Normal nocturnal oximetry. Outpatient sleep study recommended.  3.  GERD. Pepcid added.  4.  Active smoker. Recommended to stop smoking. Nicotine patch.  Patient was ambulatory without any assistance. On the day of  the discharge the patient's vitals were stable , and no other acute medical condition were reported by patient. the patient was felt safe to be discharge at home with family.  Consultants: none Procedures: none  DISCHARGE MEDICATION: Allergies as of 08/04/2018   No Known Allergies     Medication List    TAKE these medications   albuterol 108 (90 Base) MCG/ACT inhaler Commonly known as:  VENTOLIN HFA Inhale 2 puffs into the lungs every 6 (six) hours as needed for wheezing or shortness of breath. What changed:  Another medication with the same name was removed. Continue taking this medication, and follow the directions you see here.   benzonatate 100 MG capsule Commonly known as:  TESSALON Take 1 capsule (100 mg total) by mouth 3 (three) times daily.   budesonide-formoterol 80-4.5 MCG/ACT inhaler Commonly known as:  Symbicort Inhale 2 puffs into the lungs 2 (two) times a day.   dextromethorphan-guaiFENesin 30-600 MG 12hr tablet Commonly known as:  MUCINEX DM Take 1 tablet by mouth 2 (two) times daily for 7 days.   doxycycline 100 MG tablet Commonly known as:  VIBRA-TABS Take 1 tablet (100 mg total) by mouth 2 (two) times daily for 2 days.   ipratropium-albuterol 0.5-2.5 (3) MG/3ML Soln Commonly known as:  DUONEB Take 3 mLs by nebulization every 6 (six) hours as needed.   multivitamin with minerals Tabs tablet Take 1 tablet by mouth daily.   nicotine 14 mg/24hr patch Commonly known as:  NICODERM CQ - dosed in mg/24 hours  Place 1 patch (14 mg total) onto the skin daily.   omeprazole 20 MG tablet Commonly known as:  PRILOSEC OTC Take 20 mg by mouth as needed.   predniSONE 10 MG tablet Commonly known as:  DELTASONE Take 40mg  daily for 3days,Take 30mg  daily for 3days,Take 20mg  daily for 3days,Take 10mg  daily for 3days, then stop   tiotropium 18 MCG inhalation capsule Commonly known as:  Spiriva HandiHaler Place 1 capsule (18 mcg total) into inhaler and inhale daily.       No Known Allergies Discharge Instructions    Ambulatory referral to Pulmonology   Complete by:  As directed    Diet - low sodium heart healthy   Complete by:  As directed    Increase activity slowly   Complete by:  As directed      Discharge Exam: Filed Weights   08/02/18 0630 08/02/18 2058 08/03/18 2129  Weight: 77.5 kg 77.5 kg 79.3 kg   Vitals:   08/04/18 0920 08/04/18 0931  BP:  131/75  Pulse:  72  Resp:  18  Temp:  98 F (36.7 C)  SpO2: 96% 93%   General: Appear in no distress, no Rash; Oral Mucosa moist. Cardiovascular: S1 and S2 Present, no Murmur, no JVD Respiratory: Bilateral Air entry present and b  wheezes Abdomen: Bowel Sound present, Soft and no tenderness Extremities: no Pedal edema, no calf tenderness Neurology: Grossly no focal neuro deficit.  The results of significant diagnostics from this hospitalization (including imaging, microbiology, ancillary and laboratory) are listed below for reference.    Significant Diagnostic Studies: Dg Chest Port 1 View  Result Date: 08/02/2018 CLINICAL DATA:  Shortness of breath, cough, wheezing EXAM: PORTABLE CHEST 1 VIEW COMPARISON:  None. FINDINGS: Heart and mediastinal contours are within normal limits. No focal opacities or effusions. No acute bony abnormality. IMPRESSION: No active disease. Electronically Signed   By: Charlett NoseKevin  Dover M.D.   On: 08/02/2018 02:20    Microbiology: Recent Results (from the past 240 hour(s))  SARS Coronavirus 2 [facility](Hospital order, Performed [facility])     Status: None   Collection Time: 08/02/18  2:32 AM  Result Value Ref Range Status   SARS Coronavirus 2 NEGATIVE NEGATIVE Final    Comment: (NOTE) If result is NEGATIVE SARS-CoV-2 target nucleic acids are NOT DETECTED. The SARS-CoV-2 RNA is generally detectable in upper and lower  respiratory specimens during the acute phase of infection. The lowest  concentration of SARS-CoV-2 viral copies this assay can detect is  250  copies / mL. A negative result does not preclude SARS-CoV-2 infection  and should not be used as the sole basis for treatment or other  patient management decisions.  A negative result may occur with  improper specimen collection / handling, submission of specimen other  than nasopharyngeal swab, presence of viral mutation(s) within the  areas targeted by this assay, and inadequate number of viral copies  (<250 copies / mL). A negative result must be combined with clinical  observations, patient history, and epidemiological information. If result is POSITIVE SARS-CoV-2 target nucleic acids are DETECTED. The SARS-CoV-2 RNA is generally detectable in upper and lower  respiratory specimens dur ing the acute phase of infection.  Positive  results are indicative of active infection with SARS-CoV-2.  Clinical  correlation with patient history and other diagnostic information is  necessary to determine patient infection status.  Positive results do  not rule out bacterial infection or co-infection with other viruses. If result is  PRESUMPTIVE POSTIVE SARS-CoV-2 nucleic acids MAY BE PRESENT.   A presumptive positive result was obtained on the submitted specimen  and confirmed on repeat testing.  While 2019 novel coronavirus  (SARS-CoV-2) nucleic acids may be present in the submitted sample  additional confirmatory testing may be necessary for epidemiological  and / or clinical management purposes  to differentiate between  SARS-CoV-2 and other Sarbecovirus currently known to infect humans.  If clinically indicated additional testing with an alternate test  methodology [phone]) is advised. The SARS-CoV-2 RNA is generally  detectable in upper and lower respiratory sp ecimens during the acute  phase of infection. The expected result is Negative. Fact Sheet for Patients:  BoilerBrush.com.cy Fact Sheet for Healthcare Providers:  https://pope.com/ This test is not yet approved or cleared by the [place] FDA and has been authorized for detection and/or diagnosis of SARS-CoV-2 by FDA under an Emergency Use Authorization (EUA).  This EUA will remain in effect (meaning this test can be used) for the duration of the COVID-19 declaration under Section 564(b)(1) of the Act, 21 U.S.C. section 360bbb-3(b)(1), unless the authorization is terminated or revoked sooner. Performed at [facility], [address] [place], [place] [address]      Labs: CBC: Recent Labs  Lab 08/02/18 0156 08/02/18 0709  WBC 12.0* 8.6  NEUTROABS 7.7  --   HGB 16.1 15.3  HCT 48.8 45.2  MCV 94.9 92.6  PLT 260 239   Basic Metabolic Panel: Recent Labs  Lab 08/02/18 0156 08/02/18 0709  NA 137  --   K 4.1  --   CL 101  --   CO2 25  --   GLUCOSE 97  --   BUN 18  --   CREATININE 1.15 0.99  CALCIUM 9.1  --    Liver Function Tests: Recent Labs  Lab 08/02/18 0156  AST 23  ALT 16  ALKPHOS 54  BILITOT 0.6  PROT 6.7  ALBUMIN 4.2   No results for input(s): LIPASE, AMYLASE in the last 168 hours. No results for input(s): AMMONIA in the last 168 hours. Cardiac Enzymes: No results for input(s): CKTOTAL, CKMB, CKMBINDEX, TROPONINI in the last 168 hours. BNP (last 3 results) No results for input(s): BNP in the last 8760 hours. CBG: No results for input(s): GLUCAP in the last 168 hours. Time spent: 35 minutes  Signed:  Lynden Oxford  [facility] 08/04/2018

## 2018-10-01 DIAGNOSIS — J441 Chronic obstructive pulmonary disease with (acute) exacerbation: Secondary | ICD-10-CM

## 2018-10-01 DIAGNOSIS — Z720 Tobacco use: Secondary | ICD-10-CM

## 2018-10-01 DIAGNOSIS — J9601 Acute respiratory failure with hypoxia: Secondary | ICD-10-CM

## 2018-10-01 DIAGNOSIS — M545 Low back pain: Secondary | ICD-10-CM

## 2018-10-01 DIAGNOSIS — J440 Chronic obstructive pulmonary disease with acute lower respiratory infection: Secondary | ICD-10-CM | POA: Diagnosis not present

## 2018-10-01 DIAGNOSIS — K219 Gastro-esophageal reflux disease without esophagitis: Secondary | ICD-10-CM

## 2018-10-02 DIAGNOSIS — J441 Chronic obstructive pulmonary disease with (acute) exacerbation: Secondary | ICD-10-CM | POA: Diagnosis not present

## 2018-10-02 DIAGNOSIS — J9601 Acute respiratory failure with hypoxia: Secondary | ICD-10-CM | POA: Diagnosis not present

## 2018-10-02 DIAGNOSIS — J440 Chronic obstructive pulmonary disease with acute lower respiratory infection: Secondary | ICD-10-CM | POA: Diagnosis not present

## 2018-10-02 DIAGNOSIS — M545 Low back pain: Secondary | ICD-10-CM | POA: Diagnosis not present

## 2018-10-03 DIAGNOSIS — M545 Low back pain: Secondary | ICD-10-CM | POA: Diagnosis not present

## 2018-10-03 DIAGNOSIS — J9601 Acute respiratory failure with hypoxia: Secondary | ICD-10-CM | POA: Diagnosis not present

## 2018-10-03 DIAGNOSIS — J441 Chronic obstructive pulmonary disease with (acute) exacerbation: Secondary | ICD-10-CM | POA: Diagnosis not present

## 2018-10-03 DIAGNOSIS — J440 Chronic obstructive pulmonary disease with acute lower respiratory infection: Secondary | ICD-10-CM | POA: Diagnosis not present

## 2018-10-04 DIAGNOSIS — J441 Chronic obstructive pulmonary disease with (acute) exacerbation: Secondary | ICD-10-CM | POA: Diagnosis not present

## 2018-10-04 DIAGNOSIS — J9601 Acute respiratory failure with hypoxia: Secondary | ICD-10-CM | POA: Diagnosis not present

## 2018-10-04 DIAGNOSIS — M545 Low back pain: Secondary | ICD-10-CM | POA: Diagnosis not present

## 2018-10-04 DIAGNOSIS — J440 Chronic obstructive pulmonary disease with acute lower respiratory infection: Secondary | ICD-10-CM | POA: Diagnosis not present

## 2018-10-05 DIAGNOSIS — J440 Chronic obstructive pulmonary disease with acute lower respiratory infection: Secondary | ICD-10-CM | POA: Diagnosis not present

## 2018-10-05 DIAGNOSIS — J9601 Acute respiratory failure with hypoxia: Secondary | ICD-10-CM | POA: Diagnosis not present

## 2018-10-05 DIAGNOSIS — M545 Low back pain: Secondary | ICD-10-CM | POA: Diagnosis not present

## 2018-10-05 DIAGNOSIS — J441 Chronic obstructive pulmonary disease with (acute) exacerbation: Secondary | ICD-10-CM | POA: Diagnosis not present

## 2018-11-03 DIAGNOSIS — J9601 Acute respiratory failure with hypoxia: Secondary | ICD-10-CM | POA: Diagnosis not present

## 2018-11-03 DIAGNOSIS — K219 Gastro-esophageal reflux disease without esophagitis: Secondary | ICD-10-CM

## 2018-11-03 DIAGNOSIS — R0601 Orthopnea: Secondary | ICD-10-CM | POA: Diagnosis not present

## 2018-11-03 DIAGNOSIS — J441 Chronic obstructive pulmonary disease with (acute) exacerbation: Secondary | ICD-10-CM | POA: Diagnosis not present

## 2018-11-03 DIAGNOSIS — F17210 Nicotine dependence, cigarettes, uncomplicated: Secondary | ICD-10-CM

## 2018-11-03 DIAGNOSIS — J9600 Acute respiratory failure, unspecified whether with hypoxia or hypercapnia: Secondary | ICD-10-CM | POA: Diagnosis not present

## 2018-11-03 DIAGNOSIS — J209 Acute bronchitis, unspecified: Secondary | ICD-10-CM

## 2018-11-03 DIAGNOSIS — J440 Chronic obstructive pulmonary disease with acute lower respiratory infection: Secondary | ICD-10-CM | POA: Diagnosis not present

## 2018-11-04 DIAGNOSIS — J440 Chronic obstructive pulmonary disease with acute lower respiratory infection: Secondary | ICD-10-CM | POA: Diagnosis not present

## 2018-11-04 DIAGNOSIS — J9601 Acute respiratory failure with hypoxia: Secondary | ICD-10-CM | POA: Diagnosis not present

## 2018-11-04 DIAGNOSIS — J441 Chronic obstructive pulmonary disease with (acute) exacerbation: Secondary | ICD-10-CM | POA: Diagnosis not present

## 2018-11-04 DIAGNOSIS — J9600 Acute respiratory failure, unspecified whether with hypoxia or hypercapnia: Secondary | ICD-10-CM | POA: Diagnosis not present

## 2018-11-05 DIAGNOSIS — J440 Chronic obstructive pulmonary disease with acute lower respiratory infection: Secondary | ICD-10-CM | POA: Diagnosis not present

## 2018-11-05 DIAGNOSIS — J9601 Acute respiratory failure with hypoxia: Secondary | ICD-10-CM | POA: Diagnosis not present

## 2018-11-05 DIAGNOSIS — J9600 Acute respiratory failure, unspecified whether with hypoxia or hypercapnia: Secondary | ICD-10-CM | POA: Diagnosis not present

## 2018-11-05 DIAGNOSIS — J441 Chronic obstructive pulmonary disease with (acute) exacerbation: Secondary | ICD-10-CM | POA: Diagnosis not present

## 2019-02-17 ENCOUNTER — Encounter: Payer: Self-pay | Admitting: Pulmonary Disease

## 2019-02-17 ENCOUNTER — Other Ambulatory Visit: Payer: Self-pay

## 2019-02-17 ENCOUNTER — Ambulatory Visit: Payer: BLUE CROSS/BLUE SHIELD | Admitting: Pulmonary Disease

## 2019-02-17 VITALS — BP 170/82 | HR 61 | Ht 67.5 in | Wt 190.4 lb

## 2019-02-17 DIAGNOSIS — J441 Chronic obstructive pulmonary disease with (acute) exacerbation: Secondary | ICD-10-CM | POA: Diagnosis not present

## 2019-02-17 MED ORDER — ALBUTEROL SULFATE HFA 108 (90 BASE) MCG/ACT IN AERS: 2.0000 | INHALATION_SPRAY | Freq: Four times a day (QID) | RESPIRATORY_TRACT | 1 refills | Status: DC | PRN

## 2019-02-17 MED ORDER — TRELEGY ELLIPTA 200-62.5-25 MCG/INH IN AEPB: 1.0000 | INHALATION_SPRAY | Freq: Every day | RESPIRATORY_TRACT | 0 refills | Status: DC

## 2019-02-17 MED ORDER — PREDNISONE 10 MG PO TABS: 30.0000 mg | ORAL_TABLET | Freq: Every day | ORAL | 0 refills | Status: AC

## 2019-02-17 MED ORDER — AZITHROMYCIN 250 MG PO TABS: 250.0000 mg | ORAL_TABLET | Freq: Every day | ORAL | 0 refills | Status: DC

## 2019-02-17 MED ORDER — TRELEGY ELLIPTA 200-62.5-25 MCG/INH IN AEPB: 1.0000 | INHALATION_SPRAY | Freq: Every day | RESPIRATORY_TRACT | 3 refills | Status: DC

## 2019-02-17 NOTE — Progress Notes (Signed)
Subjective:    Patient ID: Bill Bishop, male    DOB: Oct 28, 1960, 58 y.o.   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 Arthritis   . COPD (chronic obstructive pulmonary disease) (HCC)    Social History   Socioeconomic History  . Marital status: Single    Spouse name: Not on file  . Number of children: Not on file  . Years of education: Not on file  . Highest education level: Not on file  Occupational History  . Not on file  Social Needs  . Financial resource strain: Not on file  . Food insecurity    Worry: Not on file    Inability: Not on file  . Transportation needs    Medical: Not on file    Non-medical: Not on file  Tobacco Use  . Smoking status: Current Every Day Smoker    Packs/day: 1.00    Years: 40.00    Pack years: 40.00    Types: Cigarettes  . Smokeless tobacco: Never Used  Substance and Sexual Activity  . Alcohol use: Not on file  . Drug use: Not on file  . Sexual activity: Not on file  Lifestyle  . Physical activity    Days per week: Not on file    Minutes per session: Not on file  . Stress: Not on file  Relationships  . Social Herbalist on phone: Not on file    Gets together: Not on file    Attends religious service: Not on file    Active member of club or organization: Not on file    Attends meetings of clubs or organizations: Not on file    Relationship status: Not on file  . Intimate partner violence    Fear of current or ex partner: Not on file    Emotionally abused: Not on file    Physically abused: Not on file    Forced sexual activity: Not on file  Other Topics Concern  . Not on file  Social History Narrative  . Not on file   No family history on file.   Review of Systems  Constitutional: Negative for fever and unexpected weight change.  HENT: Positive for congestion and sneezing. Negative for dental problem, ear pain, nosebleeds, postnasal drip, rhinorrhea, sinus pressure, sore throat and trouble swallowing.   Eyes: Negative for redness and itching.  Respiratory: Positive for cough, chest tightness, shortness of breath and wheezing.   Cardiovascular: Negative for palpitations and leg swelling.  Gastrointestinal: Negative for nausea and vomiting.  Genitourinary: Negative for dysuria.  Musculoskeletal: Negative for joint swelling.  Skin: Negative for rash.  Allergic/Immunologic: Negative.  Negative for environmental allergies, food allergies and immunocompromised state.  Neurological: Positive for headaches.  Hematological: Does not bruise/bleed easily.  Psychiatric/Behavioral: Negative for dysphoric mood. The patient is nervous/anxious.       Objective:   Physical Exam Constitutional:  Appearance: Normal appearance.  HENT:     Head: Normocephalic.     Nose: Nose normal.     Mouth/Throat:     Mouth: Mucous membranes are moist.     Pharynx: No oropharyngeal exudate.  Neck:     Musculoskeletal: Normal range of motion. No neck rigidity or muscular tenderness.  Cardiovascular:     Rate and Rhythm: Normal rate and regular rhythm.     Heart sounds: No murmur.  Pulmonary:     Effort: Pulmonary effort is normal.     Breath sounds: Normal breath sounds. No stridor. No wheezing.  Musculoskeletal: Normal range of motion.        General: No swelling.  Skin:    General: Skin is warm.     Coloration: Skin is not jaundiced or pale.  Neurological:     General: No focal  deficit present.     Mental Status: He is alert.     Cranial Nerves: No cranial nerve deficit.     Sensory: No sensory deficit.  Psychiatric:        Mood and Affect: Mood normal.    Vitals:   02/17/19 1330  BP: (!) 170/82  Pulse: 61  SpO2: 95%   Chest x-ray from April 2020-hyperinflated lung fields     Assessment & Plan:  .  Severe obstructive lung disease  .  COPD with exacerbation  .  Hypertension -He stated his blood pressure has not been high in the past -This may be spurious -I did let him know that he needs to have this followed  Plan: Azithromycin Prednisone 30 mg daily for 5 days Switch inhaler to Trelegy Albuterol as needed Obtain pulmonary function study  Inhaler technique was reviewed  I will see him back in the office in about 6 weeks Encouraged to call with any significant concerns

## 2019-02-17 NOTE — Addendum Note (Signed)
Addended by: Elton Sin on: 02/17/2019 02:15 PM   Modules accepted: Orders

## 2019-02-17 NOTE — Patient Instructions (Signed)
COPD with exacerbation  We will give you a course of antibiotics Course of steroids  Change your inhaler to Trelegy--once you start using Trelegy to not use Advair that you showed me, stop using Atrovent  Albuterol to be used as needed  We will get a breathing study on you  I will see you back in 6 weeks

## 2019-03-02 ENCOUNTER — Telehealth: Payer: Self-pay | Admitting: Pulmonary Disease

## 2019-03-02 NOTE — Telephone Encounter (Signed)
Medication name and strength: Trelegy 225mcg Provider: AO Pharmacy: [facility] in [place] Patient insurance ID: [id number] Phone:  Fax:   Was the PA started on CMM?  Yes If yes, please enter the Key: [id number] Timeframe for approval/denial: approved   [facility] is aware of approval. Nothing further needed at time of call.

## 2019-03-19 ENCOUNTER — Encounter: Payer: Self-pay | Admitting: Adult Health

## 2019-03-19 ENCOUNTER — Other Ambulatory Visit: Payer: Self-pay

## 2019-03-19 ENCOUNTER — Ambulatory Visit (INDEPENDENT_AMBULATORY_CARE_PROVIDER_SITE_OTHER): Payer: BLUE CROSS/BLUE SHIELD | Admitting: Adult Health

## 2019-03-19 DIAGNOSIS — J441 Chronic obstructive pulmonary disease with (acute) exacerbation: Secondary | ICD-10-CM

## 2019-03-19 MED ORDER — IPRATROPIUM-ALBUTEROL 0.5-2.5 (3) MG/3ML IN SOLN: 3.0000 mL | Freq: Three times a day (TID) | RESPIRATORY_TRACT | 5 refills | Status: DC

## 2019-03-19 MED ORDER — SPIRIVA HANDIHALER 18 MCG IN CAPS: 18.0000 ug | ORAL_CAPSULE | Freq: Every day | RESPIRATORY_TRACT | 5 refills | Status: AC

## 2019-03-19 MED ORDER — BUDESONIDE-FORMOTEROL FUMARATE 80-4.5 MCG/ACT IN AERO: 2.0000 | INHALATION_SPRAY | Freq: Two times a day (BID) | RESPIRATORY_TRACT | 5 refills | Status: DC

## 2019-03-19 MED ORDER — DOXYCYCLINE HYCLATE 100 MG PO TABS: 100.0000 mg | ORAL_TABLET | Freq: Two times a day (BID) | ORAL | 0 refills | Status: DC

## 2019-03-19 MED ORDER — PREDNISONE 10 MG PO TABS: ORAL_TABLET | ORAL | 0 refills | Status: DC

## 2019-03-19 NOTE — Patient Instructions (Addendum)
Doxycycline 100 mg twice daily for 7 days, take with food Prednisone taper over the next week Mucinex DM twice daily as needed for cough and congestion Work on quitting smoking Restart Symbicort 2 puffs twice daily, rinse after use Restart Spiriva daily, rinse after use We have called this into your pharmacy please let us know if these are not affordable Begin DuoNeb nebulizer 3 times daily as needed wheezing/shortness of breath  We are setting you up for a chest x-ray Follow-up with Dr. Hermina Staggers in 6 weeks with PFT and As needed   Please contact office for sooner follow up if symptoms do not improve or worsen or seek emergency care

## 2019-03-19 NOTE — Addendum Note (Signed)
Addended by: Parke Poisson E on: 03/19/2019 04:11 PM   Modules accepted: Orders

## 2019-03-19 NOTE — Progress Notes (Signed)
Virtual Visit via Telephone Note  I connected with Bill Bishop on 03/19/19 at 10:30 AM EST by telephone and verified that I am speaking with the correct person using two identifiers.  Location: Patient: Home  Provider: Office    I discussed the limitations, risks, security and privacy concerns of performing an evaluation and management service by telephone and the availability of in person appointments. I also discussed with the patient that there may be a patient responsible charge related to this service. The patient expressed understanding and agreed to proceed.   History of Present Illness: 58 year old male active smoker seen for pulmonary consult February 17, 2019 for COPD. Previous work in [employer].  On disability.  Today's televisit is for an acute office visit.  Patient complains of 3 to 4 days of productive cough with thick yellow to green mucus.  Increased wheezing and shortness of breath.  Patient says he has been coughing a lot and has some pain along his left lung and rib area.  He denies any fever, chills, body aches, hemoptysis.  No known sick contacts.  He has previously been on multiple inhalers including Advair, Trelegy, Symbicort and Spiriva.  He was given Trelegy last visit.  But we discussed that he has a deductible and was unable to afford this.  It is unclear if he ever picked up Symbicort or Spiriva in the past.  Says that he knows he took the samples and they worked well.  States he has been out of all of his samples for several weeks.  Patient has cut back on smoking.  We discussed smoking cessation. Patient has been set up for pulmonary function testing but this has not been completed as of yet.  Patient is not on oxygen Appetite is good with no nausea vomiting or diarrhea.  No loss of taste or smell.   Patient had similar symptoms to this 1 month ago when he was seen in the office says that he got better with the prednisone but now symptoms are  returning.  Observations/Objective: Speaks in full sentences without audible wheezing  Chest x-ray August 02, 2018 was clear with no acute process  Assessment and Plan: COPD exacerbation-low suspicion for COVID-19 as he has been social distancing and quarantine at home.  He has no red flag symptoms.  This seems to be a continuation of his ongoing COPD symptoms.  Advised if he develops a fever increased respiratory symptoms he is to contact immediately and we can send him for testing He needs PFTs on return. Left-sided pleuritic pain questionable etiology may be secondary to coughing and COPD will get chest x-ray. We will restart Symbicort and Spiriva.  Prescriptions have been sent to the pharmacy have advised him if these are not affordable to contact our office back.  Also will give refills of DuoNeb that he may use.  If unable to afford inhalers we can go with nebulizer for medicines which will be more cost effective. Smoking cessation was discussed  PLAN  Patient Instructions  Doxycycline 100 mg twice daily for 7 days, take with food Prednisone taper over the next week Mucinex DM twice daily as needed for cough and congestion Work on quitting smoking Restart Symbicort 2 puffs twice daily, rinse after use Restart Spiriva daily, rinse after use We have called this into your pharmacy please let us know if these are not affordable Begin DuoNeb nebulizer 3 times daily as needed wheezing/shortness of breath  We are setting you up for a chest  x-ray Follow-up with Dr. Melida Quitter in 6 weeks with PFT and As needed   Please contact office for sooner follow up if symptoms do not improve or worsen or seek emergency care      Consider pharmacy consult on return if medication cost continue to be a underlying issue  Follow Up Instructions: Follow-up in 6 weeks with Dr. Melida Quitter and As needed   Please contact office for sooner follow up if symptoms do not improve or worsen or seek emergency care      I discussed the assessment and treatment plan with the patient. The patient was provided an opportunity to ask questions and all were answered. The patient agreed with the plan and demonstrated an understanding of the instructions.   The patient was advised to call back or seek an in-person evaluation if the symptoms worsen or if the condition fails to improve as anticipated.  I provided  25 minutes of non-face-to-face time during this encounter.   Rubye Oaks, NP

## 2019-03-30 ENCOUNTER — Other Ambulatory Visit: Payer: Self-pay

## 2019-03-30 ENCOUNTER — Ambulatory Visit (INDEPENDENT_AMBULATORY_CARE_PROVIDER_SITE_OTHER): Payer: BLUE CROSS/BLUE SHIELD | Admitting: Pulmonary Disease

## 2019-03-30 ENCOUNTER — Encounter: Payer: Self-pay | Admitting: Pulmonary Disease

## 2019-03-30 VITALS — BP 150/88 | HR 68 | Temp 97.9°F | Ht 67.0 in | Wt 190.6 lb

## 2019-03-30 DIAGNOSIS — R05 Cough: Secondary | ICD-10-CM

## 2019-03-30 DIAGNOSIS — R0602 Shortness of breath: Secondary | ICD-10-CM | POA: Diagnosis not present

## 2019-03-30 DIAGNOSIS — Z23 Encounter for immunization: Secondary | ICD-10-CM

## 2019-03-30 DIAGNOSIS — J441 Chronic obstructive pulmonary disease with (acute) exacerbation: Secondary | ICD-10-CM | POA: Diagnosis not present

## 2019-03-30 DIAGNOSIS — R059 Cough, unspecified: Secondary | ICD-10-CM

## 2019-03-30 MED ORDER — ROFLUMILAST 500 MCG PO TABS: 500.0000 ug | ORAL_TABLET | Freq: Every day | ORAL | 11 refills | Status: DC

## 2019-03-30 MED ORDER — IPRATROPIUM-ALBUTEROL 0.5-2.5 (3) MG/3ML IN SOLN: 3.0000 mL | Freq: Three times a day (TID) | RESPIRATORY_TRACT | 5 refills | Status: DC

## 2019-03-30 NOTE — Patient Instructions (Signed)
COPD  Prescription for DuoNeb Prescription for Daliresp We will get your flu shot today  We will see you in 3 months We will try and get a copy of your breathing study  Call with significant concerns

## 2019-03-30 NOTE — Progress Notes (Signed)
Subjective:    Patient ID: Bill Bishop, male    DOB: [birth year], 58 y.o.   MRN: [id number]  Patient being seen for chronic obstructive pulmonary disease  Recently called in for exacerbation of symptoms Was given a course of prednisone and doxycycline This has helped her symptoms some  He is still smoking but managing to cut down some Exercise tolerance remains about the same Is trying to get more active  Chronic cough with grayish phlegm  He has COPD Has tried multiple inhalers in the past More short of breath recently  Mornings are usually worse with cough and sputum production, feeling smothered  Quit smoking about a month to month any of ago he stated Was smoking about a pack a day  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 Arthritis   . COPD (chronic obstructive pulmonary disease) (HCC)    Social History   Socioeconomic History  . Marital status: Single    Spouse name: Not on file  . Number of children: Not on file  . Years of education: Not on file  . Highest education level: Not on file  Occupational History  . Not on file  Tobacco Use  . Smoking status: Current Every Day Smoker    Packs/day: 1.00    Years: 40.00    Pack years: 40.00    Types: Cigarettes  . Smokeless tobacco: Never Used  Substance and Sexual Activity  . Alcohol use: Not on file  . Drug use: Not on file  . Sexual activity: Not on file  Other Topics Concern  . Not on file  Social History Narrative  . Not on file   Social Determinants of Health   Financial Resource Strain:   . Difficulty of Paying Living Expenses: Not on file  Food Insecurity:   . Worried About Charity fundraiser in the Last Year: Not on file  . Ran Out of Food in the Last Year: Not on file  Transportation Needs:   . Lack of Transportation (Medical): Not on file  . Lack of Transportation  (Non-Medical): Not on file  Physical Activity:   . Days of Exercise per Week: Not on file  . Minutes of Exercise per Session: Not on file  Stress:   . Feeling of Stress : Not on file  Social Connections:   . Frequency of Communication with Friends and Family: Not on file  . Frequency of Social Gatherings with Friends and Family: Not on file  . Attends Religious Services: Not on file  . Active Member of Clubs or Organizations: Not on file  . Attends Archivist Meetings: Not on file  . Marital Status: Not on file  Intimate Partner Violence:   . Fear of Current or Ex-Partner: Not on file  . Emotionally Abused: Not on file  . Physically Abused: Not on file  . Sexually Abused: Not on file   No family history on file.   Review of Systems  Constitutional: Negative for fever and unexpected weight change.  HENT: Positive for congestion and sneezing. Negative for dental problem, ear pain, nosebleeds, postnasal drip, rhinorrhea, sinus pressure, sore throat and trouble swallowing.   Eyes: Negative for redness and itching.  Respiratory: Positive for cough, chest tightness, shortness of breath and wheezing.   Cardiovascular: Negative for palpitations and leg swelling.  Gastrointestinal: Negative for nausea  and vomiting.  Genitourinary: Negative for dysuria.  Musculoskeletal: Negative for joint swelling.  Skin: Negative for rash.  Allergic/Immunologic: Negative.  Negative for environmental allergies, food allergies and immunocompromised state.  Neurological: Positive for headaches.  Hematological: Does not bruise/bleed easily.  Psychiatric/Behavioral: Negative for dysphoric mood. The patient is nervous/anxious.   All other systems reviewed and are negative.     Objective:   Physical Exam Constitutional:      Appearance: Normal appearance.  HENT:     Head: Normocephalic.     Nose: Nose normal.     Mouth/Throat:     Mouth: Mucous membranes are moist.     Pharynx: No  oropharyngeal exudate.  Cardiovascular:     Rate and Rhythm: Normal rate and regular rhythm.     Heart sounds: No murmur.  Pulmonary:     Effort: Pulmonary effort is normal.     Breath sounds: Normal breath sounds. No stridor. No wheezing.  Musculoskeletal:     Cervical back: Normal range of motion. No rigidity. No muscular tenderness.  Neurological:     Mental Status: He is alert.    Vitals:   03/30/19 1342  BP: (!) 150/88  Pulse: 68  Temp: 97.9 F (36.6 C)  SpO2: 95%   Chest x-ray from April 2020-hyperinflated lung fields     Assessment & Plan:  .  Severe obstructive lung disease -Recent exacerbation for which he got a course of antibiotics and steroids -Feeling better  .  COPD with exacerbation -Better with recent treatment  .  Hypertension -Follow-up with primary  Plan: Continue Symbicort, Spiriva DuoNeb nebulization as needed   Inhaler technique was reviewed  I will see him back in the office in about 3 months Encouraged to call with any significant concerns  Add Daliresp Flu shot today  We will call for copy of this PFT

## 2019-04-20 ENCOUNTER — Telehealth: Payer: Self-pay | Admitting: Pulmonary Disease

## 2019-04-20 NOTE — Telephone Encounter (Signed)
Received PA request from [facility] today for patient's Daliresp. Attempted to do PA online but it states patient does not have active coverage with [employer]. Will call the number provided on PA form to initiate PA request.

## 2019-04-28 ENCOUNTER — Telehealth: Payer: Self-pay | Admitting: Pulmonary Disease

## 2019-04-28 NOTE — Telephone Encounter (Signed)
Inform patient PFT reviewed  Severe obstructive disease No change to current treatment  Will follow up in the office as scheduled

## 2019-04-30 NOTE — Telephone Encounter (Signed)
Patient is on recall list for March schedule.

## 2019-06-23 ENCOUNTER — Telehealth: Payer: Self-pay | Admitting: Acute Care

## 2019-06-23 DIAGNOSIS — R059 Cough, unspecified: Secondary | ICD-10-CM

## 2019-06-23 DIAGNOSIS — R05 Cough: Secondary | ICD-10-CM

## 2019-06-23 NOTE — Telephone Encounter (Signed)
Received an after hours call from patient stating that he is coughing up yellow secretions and that he wants an antibiotic called in. He was diagnosed with Covid 05/20/2019. He had vomiting and diarrhea with this. He also had cough and congestion. He had fevers up through 2 weeks ago.Marland Kitchen He was not admitted to the hospital.  He has had worsening of cough,Yellow secretions and  no fever. Triage, please arrange for the patient to be seen 3/18 by NP or MD   with a CXR  prior. He was going to check with family to see what time he could get a ride to the office tomorrow. Please call him early am to get him scheduled. He is outside the quarantine window, so he can come into the office . He does not have fever.Thanks so much

## 2019-06-24 ENCOUNTER — Telehealth: Payer: Self-pay | Admitting: Pulmonary Disease

## 2019-06-24 ENCOUNTER — Other Ambulatory Visit: Payer: Self-pay

## 2019-06-24 ENCOUNTER — Ambulatory Visit (INDEPENDENT_AMBULATORY_CARE_PROVIDER_SITE_OTHER): Payer: BLUE CROSS/BLUE SHIELD

## 2019-06-24 ENCOUNTER — Ambulatory Visit (INDEPENDENT_AMBULATORY_CARE_PROVIDER_SITE_OTHER): Payer: BLUE CROSS/BLUE SHIELD | Admitting: Pulmonary Disease

## 2019-06-24 ENCOUNTER — Encounter: Payer: Self-pay | Admitting: Pulmonary Disease

## 2019-06-24 VITALS — BP 156/84 | HR 93 | Temp 97.1°F | Ht 67.0 in | Wt 180.2 lb

## 2019-06-24 DIAGNOSIS — R05 Cough: Secondary | ICD-10-CM

## 2019-06-24 DIAGNOSIS — R059 Cough, unspecified: Secondary | ICD-10-CM

## 2019-06-24 DIAGNOSIS — J441 Chronic obstructive pulmonary disease with (acute) exacerbation: Secondary | ICD-10-CM | POA: Diagnosis not present

## 2019-06-24 MED ORDER — OXYCODONE-ACETAMINOPHEN 2.5-325 MG PO TABS: 1.0000 | ORAL_TABLET | ORAL | 0 refills | Status: DC | PRN

## 2019-06-24 MED ORDER — LEVOFLOXACIN 750 MG PO TABS: 750.0000 mg | ORAL_TABLET | Freq: Every day | ORAL | 0 refills | Status: DC

## 2019-06-24 MED ORDER — PREDNISONE 10 MG PO TABS: 20.0000 mg | ORAL_TABLET | Freq: Two times a day (BID) | ORAL | 0 refills | Status: DC

## 2019-06-24 NOTE — Telephone Encounter (Signed)
lmtcb for pt. Dr. Wynona Neat has openings, can schedule with AO with CXR prior.

## 2019-06-24 NOTE — Telephone Encounter (Signed)
Patient has requested prescriptions from OV be [facility] of [facility]. Levaquin and Prednisone prescriptions sent to [facility] [place]. LM on VM (DPR), that Levaquin and Prednisone have been changed to requested [facility], but Percocet will have to be sent by Dr. Wynona Neat, and that may be later today. Percocet prescription cancelled at [facility].  Dr. Wynona Neat, can you send a percocet prescription to [facility] [place]  Message routed to Dr. Wynona Neat

## 2019-06-24 NOTE — Addendum Note (Signed)
Addended by: Sheran Luz on: 06/24/2019 09:21 AM   Modules accepted: Orders

## 2019-06-24 NOTE — Telephone Encounter (Signed)
CXR order placed. Will sign off.

## 2019-06-24 NOTE — Patient Instructions (Signed)
COPD exacerbation  Levaquin 750 daily for 5 days Prednisone 20 p.o. twice daily for 5 days  Percocet 1 p.o. q. 4 as needed for pain  Obtain chest x-ray   60-month follow-up

## 2019-06-24 NOTE — Telephone Encounter (Signed)
Pt scheduled for 2:30 today w/cxr to see Dr. Wynona Neat.

## 2019-06-24 NOTE — Progress Notes (Signed)
Subjective:    Patient ID: Bill Bishop, male    DOB: 05-Jun-1960, 59 y.o.   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 Arthritis   . COPD (chronic obstructive pulmonary disease) (HCC)    Social History   Socioeconomic History  . Marital status: Single    Spouse name: Not on file  . Number of children: Not on file  . Years of education: Not on file  . Highest education level: Not on file  Occupational History  . Not on file  Tobacco Use  . Smoking status: Current Every Day Smoker    Packs/day: 0.25    Years: 40.00    Pack years: 10.00    Types: Cigarettes  . Smokeless tobacco: Never Used  . Tobacco comment: smokes 5-6/day  Substance and Sexual Activity  . Alcohol use: Not on file  . Drug use: Not on file  . Sexual activity: Not on file  Other Topics Concern  . Not on file  Social History Narrative  . Not on file   Social Determinants of Health   Financial Resource Strain:   . Difficulty of Paying Living Expenses:   Food Insecurity:   . Worried About Charity fundraiser in the Last Year:   . Arboriculturist in the Last Year:   Transportation Needs:   . Film/video editor (Medical):   Marland Kitchen Lack of Transportation (Non-Medical):   Physical Activity:   . Days of Exercise per Week:   . Minutes of  Exercise per Session:   Stress:   . Feeling of Stress :   Social Connections:   . Frequency of Communication with Friends and Family:   . Frequency of Social Gatherings with Friends and Family:   . Attends Religious Services:   . Active Member of Clubs or Organizations:   . Attends Archivist Meetings:   Marland Kitchen Marital Status:   Intimate Partner Violence:   . Fear of Current or Ex-Partner:   . Emotionally Abused:   Marland Kitchen Physically Abused:   . Sexually Abused:    History reviewed. No pertinent family history.   Review of Systems  Constitutional: Negative for fever and unexpected weight change.  HENT: Positive for congestion and sneezing. Negative for dental problem, ear pain, nosebleeds, postnasal drip, rhinorrhea, sinus pressure, sore throat and trouble swallowing.   Eyes: Negative for redness and itching.  Respiratory: Positive for cough, chest tightness, shortness of breath and wheezing.   Cardiovascular: Negative for palpitations and leg swelling.  Gastrointestinal: Negative for nausea and vomiting.  Genitourinary: Negative for dysuria.  Musculoskeletal: Negative for joint swelling.  Skin: Negative for rash.  Allergic/Immunologic: Negative.  Negative for environmental allergies, food allergies and immunocompromised state.  Neurological: Negative for headaches.  Hematological: Does not bruise/bleed easily.  Psychiatric/Behavioral: Negative for dysphoric mood.  All other systems reviewed and are negative.     Objective:   Physical Exam Constitutional:      Appearance: Normal appearance.  HENT:     Head: Normocephalic.     Nose: Nose normal.     Mouth/Throat:     Mouth: Mucous membranes are moist.     Pharynx: No oropharyngeal exudate.  Cardiovascular:     Rate and Rhythm: Normal rate and regular rhythm.     Pulses: Normal pulses.     Heart sounds: No murmur.  Pulmonary:     Breath sounds: No stridor. Wheezing and rhonchi present. No rales.  Musculoskeletal:      Cervical back: Normal range of motion. No rigidity. No muscular tenderness.  Neurological:     Mental Status: He is alert.    Vitals:   06/24/19 1437  BP: (!) 156/84  Pulse: 93  Temp: (!) 97.1 F (36.2 C)  SpO2: 97%   Chest x-ray from April 2020-hyperinflated lung fields     Assessment & Plan:  .  Severe obstructive lung disease   .  COPD with exacerbation -Acute exacerbation at present -We will give a course of Levaquin 750 daily for 5 days -Prednisone 20 p.o. twice daily for 5 days -Course of Percocet for chest pains  .  Hypertension -Follow-up with primary  Plan: Continue Symbicort, Spiriva DuoNeb nebulization as needed We will see him back in about 3 months Medication noncompliance  Continues to smoke Obtain chest x-ray today

## 2019-06-28 ENCOUNTER — Telehealth: Payer: Self-pay | Admitting: Pulmonary Disease

## 2019-06-28 ENCOUNTER — Other Ambulatory Visit: Payer: Self-pay | Admitting: Pulmonary Disease

## 2019-06-28 MED ORDER — OXYCODONE-ACETAMINOPHEN 5-325 MG PO TABS: 1.0000 | ORAL_TABLET | ORAL | 0 refills | Status: DC | PRN

## 2019-06-28 NOTE — Telephone Encounter (Signed)
ATC Patient.  Left detailed message on VM (DPR), that percoc[facility] [place]. Nothing further at this time.

## 2019-06-28 NOTE — Telephone Encounter (Signed)
[facility]ercocet prescription sent [place] [facility]

## 2019-06-28 NOTE — Telephone Encounter (Signed)
Called and spoke with Patient.  Patient stated [facility] [place] has not received Percocet prescription, at this time. Patient stated he finished Levaquin today, and is still taking Prednisone, but has increased productive cough with gray phlegm.  Patient is requesting something for his cough [facility]   Previous percocet prescription was sent to [facility].  Patient stated [facility] will not accept insurance card. Patient aware Dr Wynona Neat has gone for today, but someone will  call him 06/29/19.   Message routed to Dr. Wynona Neat to advise

## 2019-06-28 NOTE — Progress Notes (Signed)
Percocet prescription sent to [place] [facility]

## 2019-06-29 ENCOUNTER — Other Ambulatory Visit: Payer: Self-pay | Admitting: Pulmonary Disease

## 2019-06-29 ENCOUNTER — Telehealth: Payer: Self-pay | Admitting: Pulmonary Disease

## 2019-06-29 MED ORDER — OXYCODONE-ACETAMINOPHEN 5-325 MG PO TABS: 1.0000 | ORAL_TABLET | ORAL | 0 refills | Status: DC | PRN

## 2019-06-29 NOTE — Telephone Encounter (Signed)
Prescription sent to [facility]

## 2019-06-29 NOTE — Telephone Encounter (Signed)
06/29/2019  I have reviewed [place] PMP aware.  Patient's overdose risk factor score is 190  Patient's last narcotic prescription was picked up from [facility] was on 10/15/2018 which was a tramadol 50 mg tablet.  [facility] [place] [facility].  Does not appear that any other narcotics have been picked at.  Elisha Headland, FNP

## 2019-06-29 NOTE — Telephone Encounter (Signed)
Called and spoke with pt letting him know the info stated by AO. Pt verbalized understanding. Nothing further needed.

## 2019-06-29 NOTE — Telephone Encounter (Signed)
Spoke with pt. He is aware that Dr. Wynona Neat resent his prescription. Nothing further was needed.

## 2019-06-29 NOTE — Telephone Encounter (Signed)
Dr. Wynona Neat - can you please resend this prescription? Thanks!

## 2019-06-29 NOTE — Telephone Encounter (Signed)
My recommendation is Mucinex-it is over-the-counter He may also use guaifenesin-over-the-counter  I looked through the epic system and the prescription was sent at 9:28 AM yesterday to [facility] [place]  I do not mind sending it again, we need to confirm that this has not been filled

## 2019-06-29 NOTE — Progress Notes (Signed)
[facility]ercocet prescription sent to [facility]

## 2019-08-13 ENCOUNTER — Other Ambulatory Visit: Payer: Self-pay

## 2019-08-13 ENCOUNTER — Ambulatory Visit (INDEPENDENT_AMBULATORY_CARE_PROVIDER_SITE_OTHER): Payer: Self-pay | Admitting: Primary Care

## 2019-08-13 ENCOUNTER — Encounter: Payer: Self-pay | Admitting: Primary Care

## 2019-08-13 ENCOUNTER — Telehealth: Payer: Self-pay | Admitting: Primary Care

## 2019-08-13 DIAGNOSIS — J441 Chronic obstructive pulmonary disease with (acute) exacerbation: Secondary | ICD-10-CM

## 2019-08-13 MED ORDER — IPRATROPIUM-ALBUTEROL 0.5-2.5 (3) MG/3ML IN SOLN: 3.0000 mL | Freq: Three times a day (TID) | RESPIRATORY_TRACT | 5 refills | Status: DC

## 2019-08-13 MED ORDER — PREDNISONE 10 MG PO TABS: ORAL_TABLET | ORAL | 0 refills | Status: DC

## 2019-08-13 MED ORDER — DOXYCYCLINE HYCLATE 100 MG PO TABS: 100.0000 mg | ORAL_TABLET | Freq: Two times a day (BID) | ORAL | 0 refills | Status: DC

## 2019-08-13 NOTE — Telephone Encounter (Signed)
Can you please help with benefits investigation for patient. States that he can not afford either Symbicort/Spiriva or Trelegy. He needs to be on triple therapy. What is the cheapest alternative for patient

## 2019-08-13 NOTE — Patient Instructions (Signed)
Recommendations: Continue DuoNeb 4 times daily We will get in touch with pharmacy to see what inhaler regimen would be most affordable for you  Rx: Prednisone taper as prescribed Doxycycline 1 tablet twice daily x7 days  Follow-up: As needed if symptoms do not improve or worsen

## 2019-08-13 NOTE — Progress Notes (Signed)
Virtual Visit via Telephone Note  I connected with Bill Bishop on 08/13/19 at  3:30 PM EDT by telephone and verified that I am speaking with the correct person using two identifiers.  Location: Patient: Home Provider: Office   I discussed the limitations, risks, security and privacy concerns of performing an evaluation and management service by telephone and the availability of in person appointments. I also discussed with the patient that there may be a patient responsible charge related to this service. The patient expressed understanding and agreed to proceed.   History of Present Illness:  59 year-old male, former smoker quit May 2021 (10 pack year hx).  Past medical history significant for COPD, COVID-19. Patient of Dr. Wynona Neat, patient recently seen on 06/24/19.  Maintained on Symbicort 80 and Spiriva Respimat.  08/13/2019  Patient contacted today for acute televisit.  Reports productive cough for 4-5 days with yellow mucus. Associated shortness of breath and wheezing.  Not currently on any maintenance inhalers due to cost.  States that he can not afford Symbicort, Spiriva or Daliresp. He has a nebulizer machine at home and has been using Duoneb three times a day. Reports that [facility] He is on 2L of oxygen. Not currently smoking, he quit 1.5 months ago.   Observations/Objective:  - Able to speak in full sentences, no overt wheezing  - Moderate cough   Assessment and Plan:  Acute COPD exacerbation: - Productive cough x 3-4 days with associated shortness of breath/wheezing - Not currently on any maintenance inhalers d/t cost - RX doxycycline 1 tab twice daily x 7 days, prednisone taper  - Continue Duoneb four times a day scheduled (refill sent to pharmacy) - Recommend patient take mucinex twice daily for congestion  - Will see if pharamcy can do a benefits investigation or help with patient assistance  - Follow-up scheduled in June with Dr.  Wynona Neat   Follow Up Instructions:   - As needed if symptoms do not improve or worsen  I discussed the assessment and treatment plan with the patient. The patient was provided an opportunity to ask questions and all were answered. The patient agreed with the plan and demonstrated an understanding of the instructions.   The patient was advised to call back or seek an in-person evaluation if the symptoms worsen or if the condition fails to improve as anticipated.  I provided 18 minutes of non-face-to-face time during this encounter.   Glenford Bayley, NP

## 2019-08-16 NOTE — Telephone Encounter (Signed)
Was unable to process test claims through [facility]. Called plan, rep Marlowe Kays quoted the following copays:  Trelegy-  [phone] for 1 month supply. States that plan is not paying at all due to unmet deductible. Once deductible has been met patient will pay 50% coinsurance for this med. Rep states she could only discuss remaining deductible with patient.  Symbicort- $171.83 for 1 month supply. Deductible applied to claim.  Once deductible has been met, cheapest options for patient would be Wixela inhaler, and generic Symbicort inhaler. Both would have $20 copay for 1 month supply. Other alternatives- Advair HFA and Dulera would have $60 copay for 1 month supply after deductible.  Spiriva Repimat has $459.13 copay, Spiriva handihaler- $441.67,  Tudorza- $528.67, Incruse- $327.19.  Patient has Medicare and is unable to use copay cards. Patient likely a good candidate for patient assistance.  [employer] Phone# [phone]

## 2019-08-18 NOTE — Telephone Encounter (Signed)
Thank you, will discuss with him at follow-up in June

## 2019-09-20 ENCOUNTER — Other Ambulatory Visit: Payer: Self-pay

## 2019-09-20 ENCOUNTER — Encounter: Payer: Self-pay | Admitting: Pulmonary Disease

## 2019-09-20 ENCOUNTER — Ambulatory Visit (INDEPENDENT_AMBULATORY_CARE_PROVIDER_SITE_OTHER): Payer: Self-pay | Admitting: Pulmonary Disease

## 2019-09-20 VITALS — BP 128/86 | HR 82 | Temp 97.8°F | Ht 67.0 in | Wt 167.0 lb

## 2019-09-20 DIAGNOSIS — J441 Chronic obstructive pulmonary disease with (acute) exacerbation: Secondary | ICD-10-CM

## 2019-09-20 DIAGNOSIS — R0602 Shortness of breath: Secondary | ICD-10-CM

## 2019-09-20 MED ORDER — PREDNISONE 10 MG PO TABS: 20.0000 mg | ORAL_TABLET | Freq: Two times a day (BID) | ORAL | 0 refills | Status: DC

## 2019-09-20 MED ORDER — LEVOFLOXACIN 750 MG PO TABS: 750.0000 mg | ORAL_TABLET | Freq: Every day | ORAL | 0 refills | Status: DC

## 2019-09-20 MED ORDER — BUDESONIDE-FORMOTEROL FUMARATE 160-4.5 MCG/ACT IN AERO: 2.0000 | INHALATION_SPRAY | Freq: Two times a day (BID) | RESPIRATORY_TRACT | 12 refills | Status: DC

## 2019-09-20 MED ORDER — IPRATROPIUM-ALBUTEROL 0.5-2.5 (3) MG/3ML IN SOLN: 3.0000 mL | Freq: Four times a day (QID) | RESPIRATORY_TRACT | 5 refills | Status: DC

## 2019-09-20 NOTE — Patient Instructions (Signed)
COPD exacerbation  Levaquin 750 daily for 5 days Prednisone 20 twice daily for 5 days Prescription for DuoNeb to be used 4 times a day Prescription for Symbicort-generic sent in  We will see you in 3-4 months  You do need to quit smoking Continuing to smoke will continue to promote frequent exacerbations and loss of lung function

## 2019-09-20 NOTE — Progress Notes (Signed)
Subjective:    Patient ID: Bill Bishop, male    DOB: 01/07/1961, 59 y.o.   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 Arthritis   . COPD (chronic obstructive pulmonary disease) (HCC)    Social History   Socioeconomic History  . Marital status: Single    Spouse name: Not on file  . Number of children: Not on file  . Years of education: Not on file  . Highest education level: Not on file  Occupational History  . Not on file  Tobacco Use  . Smoking status: Current Every Day Smoker    Packs/day: 0.25    Years: 40.00    Pack years: 10.00    Types: Cigarettes  . Smokeless tobacco: Never Used  . Tobacco comment: smokes 5-6/day  Vaping Use  . Vaping Use: Never used  Substance and Sexual Activity  . Alcohol use: Not on file  . Drug use: Not on file  . Sexual activity: Not on file  Other Topics Concern  . Not on file  Social History Narrative  . Not on file   Social Determinants of Health   Financial Resource Strain:   . Difficulty of Paying Living Expenses:   Food Insecurity:   . Worried About Programme researcher, broadcasting/film/video in the Last Year:   . Barista in the Last Year:   Transportation Needs:   . Freight forwarder (Medical):   Marland Kitchen Lack of Transportation (Non-Medical):   Physical Activity:   . Days of Exercise per Week:   . Minutes of Exercise per Session:   Stress:   . Feeling of Stress :   Social Connections:   . Frequency of Communication with Friends and Family:   .  Frequency of Social Gatherings with Friends and Family:   . Attends Religious Services:   . Active Member of Clubs or Organizations:   . Attends Banker Meetings:   Marland Kitchen Marital Status:   Intimate Partner Violence:   . Fear of Current or Ex-Partner:   . Emotionally Abused:   Marland Kitchen Physically Abused:   . Sexually Abused:    No family history on file.   Review of Systems  Constitutional: Negative for fever and unexpected weight change.  HENT: Negative for congestion, dental problem, ear pain, nosebleeds, postnasal drip, rhinorrhea, sinus pressure, sneezing, sore throat and trouble swallowing.   Eyes: Negative for redness and itching.  Respiratory: Positive for cough, chest tightness, shortness of breath and wheezing.   Cardiovascular: Negative for palpitations and leg swelling.  Gastrointestinal: Negative for nausea and vomiting.  Genitourinary: Negative for dysuria.  Musculoskeletal: Negative for joint swelling.  Skin: Negative for rash.  Allergic/Immunologic: Negative.  Negative for environmental allergies, food allergies and immunocompromised state.  Neurological: Negative for headaches.  Hematological: Does not bruise/bleed easily.  Psychiatric/Behavioral: Negative for dysphoric mood.  All other systems reviewed and are negative.     Objective:   Physical Exam Constitutional:      Appearance: Normal appearance.  HENT:     Head: Normocephalic.     Nose: Nose normal.     Mouth/Throat:     Mouth: Mucous membranes are moist.     Pharynx: No oropharyngeal exudate.  Eyes:     General:        Right eye: No discharge.        Left eye: No discharge.     Pupils: Pupils are equal, round, and reactive to light.  Cardiovascular:     Rate and Rhythm: Normal rate and regular rhythm.     Pulses: Normal pulses.     Heart sounds: No murmur heard.   Pulmonary:     Effort: Respiratory distress present.     Breath sounds: No stridor. Wheezing and rhonchi present. No rales.    Musculoskeletal:     Cervical back: No rigidity. No muscular tenderness.  Neurological:     Mental Status: He is alert.    Vitals:   09/20/19 1120  BP: 128/86  Pulse: 82  Temp: 97.8 F (36.6 C)  SpO2: 90%   Chest x-ray from April 2020-hyperinflated lung fields     Assessment & Plan:  .  Severe obstructive lung disease  .  COPD with exacerbation -Acute exacerbation at present -We will give a course of Levaquin 750 daily for 5 days -Prednisone 20 p.o. twice daily for 5 days  -Refill for Symbicort -DuoNeb to be used 4 times a day  He continues to smoke -Smoking cessation counseling  .  Hypertension -Follow-up with primary  Only medication he may be able to afford will be Wixela or Symbicort generic -Prescription sent in  Call with significant concerns

## 2020-02-01 ENCOUNTER — Other Ambulatory Visit: Payer: Self-pay | Admitting: Pulmonary Disease

## 2020-02-01 ENCOUNTER — Telehealth: Payer: Self-pay | Admitting: Pulmonary Disease

## 2020-02-01 MED ORDER — DOXYCYCLINE HYCLATE 100 MG PO TABS: 100.0000 mg | ORAL_TABLET | Freq: Two times a day (BID) | ORAL | 0 refills | Status: DC

## 2020-02-01 MED ORDER — PREDNISONE 20 MG PO TABS: 40.0000 mg | ORAL_TABLET | Freq: Every day | ORAL | 0 refills | Status: DC

## 2020-02-01 NOTE — Telephone Encounter (Signed)
Spoke with the pt  He is c/o increased SOB x 3-4 days  He is coughing up green sputum  He also c/o wheezing and chest tightness  Sats on RA have been around 85-88% and so he has been using his o2 more and sats ok at 94%2lpm  He denies any f/c/s, body aches  Only respiratory med he is on right now is duonebs about 3-4 x per day  He can not afford other inhalers and is trying to get pt assistance  He has not been vaccinated  Please advise thanks

## 2020-02-01 NOTE — Progress Notes (Signed)
Prednisone and doxycycline sent into pharmacy

## 2020-02-01 NOTE — Telephone Encounter (Signed)
Will send in doxycycline and prednisone

## 2020-02-01 NOTE — Telephone Encounter (Signed)
Pt aware rxs being sent  Nothing further needed

## 2020-02-02 ENCOUNTER — Encounter: Payer: Self-pay | Admitting: Pulmonary Disease

## 2020-02-02 ENCOUNTER — Other Ambulatory Visit: Payer: Self-pay | Admitting: Pulmonary Disease

## 2020-02-04 ENCOUNTER — Telehealth: Payer: Self-pay | Admitting: Pulmonary Disease

## 2020-02-04 MED ORDER — PREDNISONE 20 MG PO TABS: 40.0000 mg | ORAL_TABLET | Freq: Every day | ORAL | 0 refills | Status: AC

## 2020-02-04 MED ORDER — DOXYCYCLINE HYCLATE 100 MG PO TABS
100.0000 mg | ORAL_TABLET | Freq: Two times a day (BID) | ORAL | 0 refills | Status: DC
Start: 2020-02-04 — End: 2020-03-09

## 2020-02-04 NOTE — Telephone Encounter (Signed)
Spoke with the pt  The rxs that we sent 02/01/20 [facility]  [facility] [place]  Nothing further needed

## 2020-03-07 ENCOUNTER — Telehealth: Payer: Self-pay | Admitting: Pulmonary Disease

## 2020-03-07 NOTE — Telephone Encounter (Signed)
Primary Pulmonologist: Olalere Last office visit and with whom: 09/20/2019 Olalere What do we see them for (pulmonary problems): COPD Last OV assessment/plan:  Assessment & Plan:  .  Severe obstructive lung disease  .  COPD with exacerbation -Acute exacerbation at present -We will give a course of Levaquin 750 daily for 5 days -Prednisone 20 p.o. twice daily for 5 days  -Refill for Symbicort -DuoNeb to be used 4 times a day  He continues to smoke -Smoking cessation counseling  .  Hypertension -Follow-up with primary  Only medication he may be able to afford will be Wixela or Symbicort generic -Prescription sent in  Call with significant concerns    Patient Instructions by Tomma Lightning, MD at 09/20/2019 10:45 AM Author: Tomma Lightning, MD Author Type: Physician Filed: 09/20/2019 11:48 AM  Note Status: Signed Cosign: Cosign Not Required Encounter Date: 09/20/2019  Editor: Tomma Lightning, MD (Physician)                 COPD exacerbation  Levaquin 750 daily for 5 days Prednisone 20 twice daily for 5 days Prescription for DuoNeb to be used 4 times a day Prescription for Symbicort-generic sent in  We will see you in 3-4 months  You do need to quit smoking Continuing to smoke will continue to promote frequent exacerbations and loss of lung function    Instructions  COPD exacerbation  Levaquin 750 daily for 5 days Prednisone 20 twice daily for 5 days Prescription for DuoNeb to be used 4 times a day Prescription for Symbicort-generic sent in  We will see you in 3-4 months  You do need to quit smoking Continuing to smoke will continue to promote frequent exacerbations and loss of lung function        Reason for call: Cough with thick yellow/beigh mucus for 4-5 days.  Migraine headache from coughing.  Pain in left lung.  Oxygen level down to 84% without oxygen.  Wearing 2L 24/7 for the last week or so instead of just at night.  Feels like he is  smother.  Doing nebulizer treatments, this helps.  Denies any fever, chills or body aches.  Feels he is having a COPD flare. Has no maintenance inhalers, cannot afford them.  No insurance to cover meds.  Using [facility] to get meds.  Wheezing all the time.  Tried musinex, robitussin with little relief.  Has not had had covid vaccines.  Has had Covid twice.  Dr. Wynona Neat, please advise.  Thank you.  (examples of things to ask: : When did symptoms start? Fever? Cough? Productive? Color to sputum? More sputum than usual? Wheezing? Have you needed increased oxygen? Are you taking your respiratory medications? What over the counter measures have you tried?)  No Known Allergies  Immunization History  Administered Date(s) Administered  . Influenza,inj,Quad PF,6+ Mos 03/30/2019

## 2020-03-09 ENCOUNTER — Other Ambulatory Visit: Payer: Self-pay | Admitting: Pulmonary Disease

## 2020-03-09 MED ORDER — DOXYCYCLINE HYCLATE 100 MG PO TABS: 100.0000 mg | ORAL_TABLET | Freq: Two times a day (BID) | ORAL | 0 refills | Status: DC

## 2020-03-09 MED ORDER — PREDNISONE 20 MG PO TABS: 20.0000 mg | ORAL_TABLET | Freq: Every day | ORAL | 0 refills | Status: DC

## 2020-03-09 MED ORDER — HYDROCODONE-HOMATROPINE 5-1.5 MG/5ML PO SYRP
5.0000 mL | ORAL_SOLUTION | Freq: Four times a day (QID) | ORAL | 0 refills | Status: DC | PRN
Start: 2020-03-09 — End: 2022-10-23

## 2020-03-09 NOTE — Progress Notes (Signed)
Prescription for doxycycline, Hycodan and prednisone called in

## 2020-03-10 NOTE — Telephone Encounter (Signed)
Called spoke with patient.  Let him know that since the one was controlled they would have to call the pharmacy and have them contact the one who wants or need the RX and they have to transfer it .   Told them to call if they still had trouble.

## 2020-03-10 NOTE — Telephone Encounter (Signed)
Pt called back meds [facility] [id number] [place] they give him the discount antibodic,predisone and cough meds  Pt number [phone]

## 2020-06-11 IMAGING — DX DG CHEST 2V
2 series · 2 of 2 positions shown · non-contrast
Comparison: 11/26/2018

CLINICAL DATA: Cough and chest congestion.

EXAM:
CHEST - 2 VIEW

[chest pa]
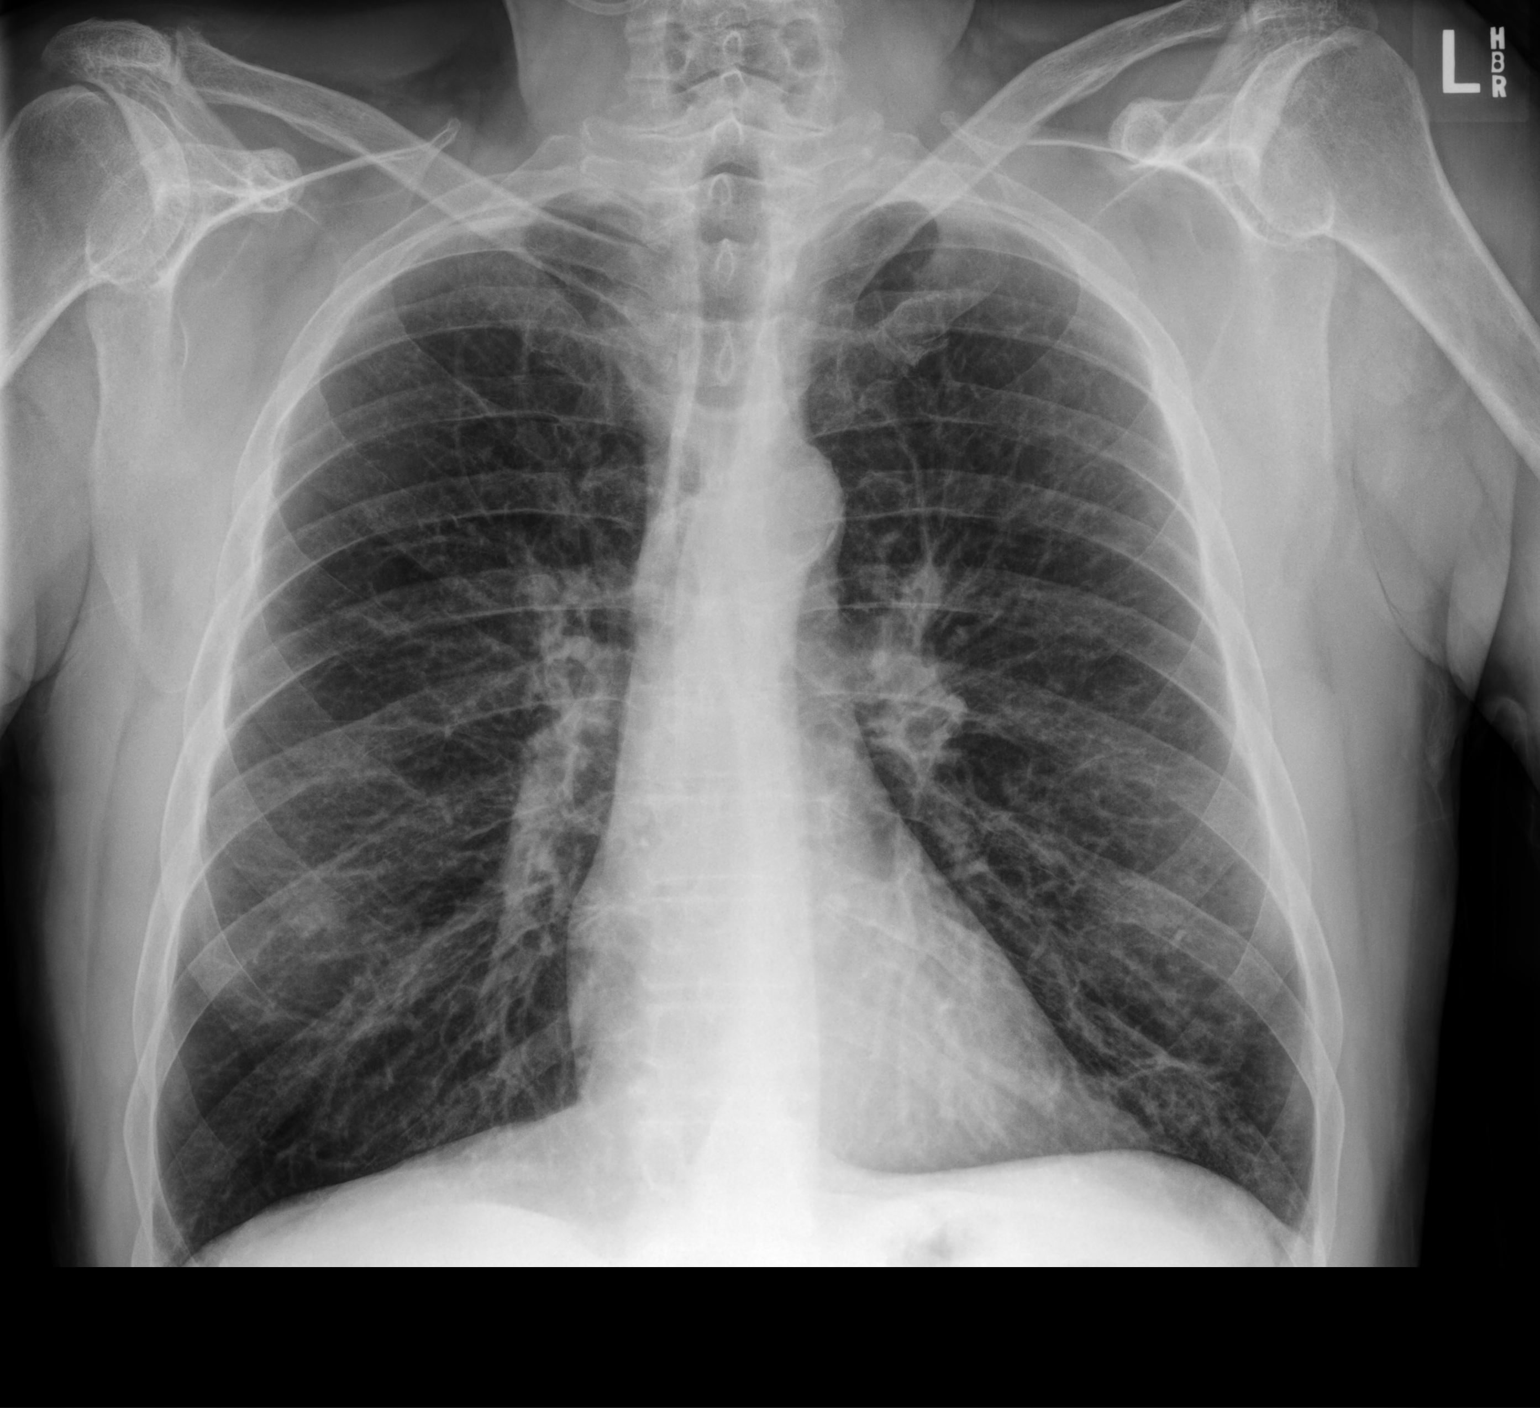

[chest lat]
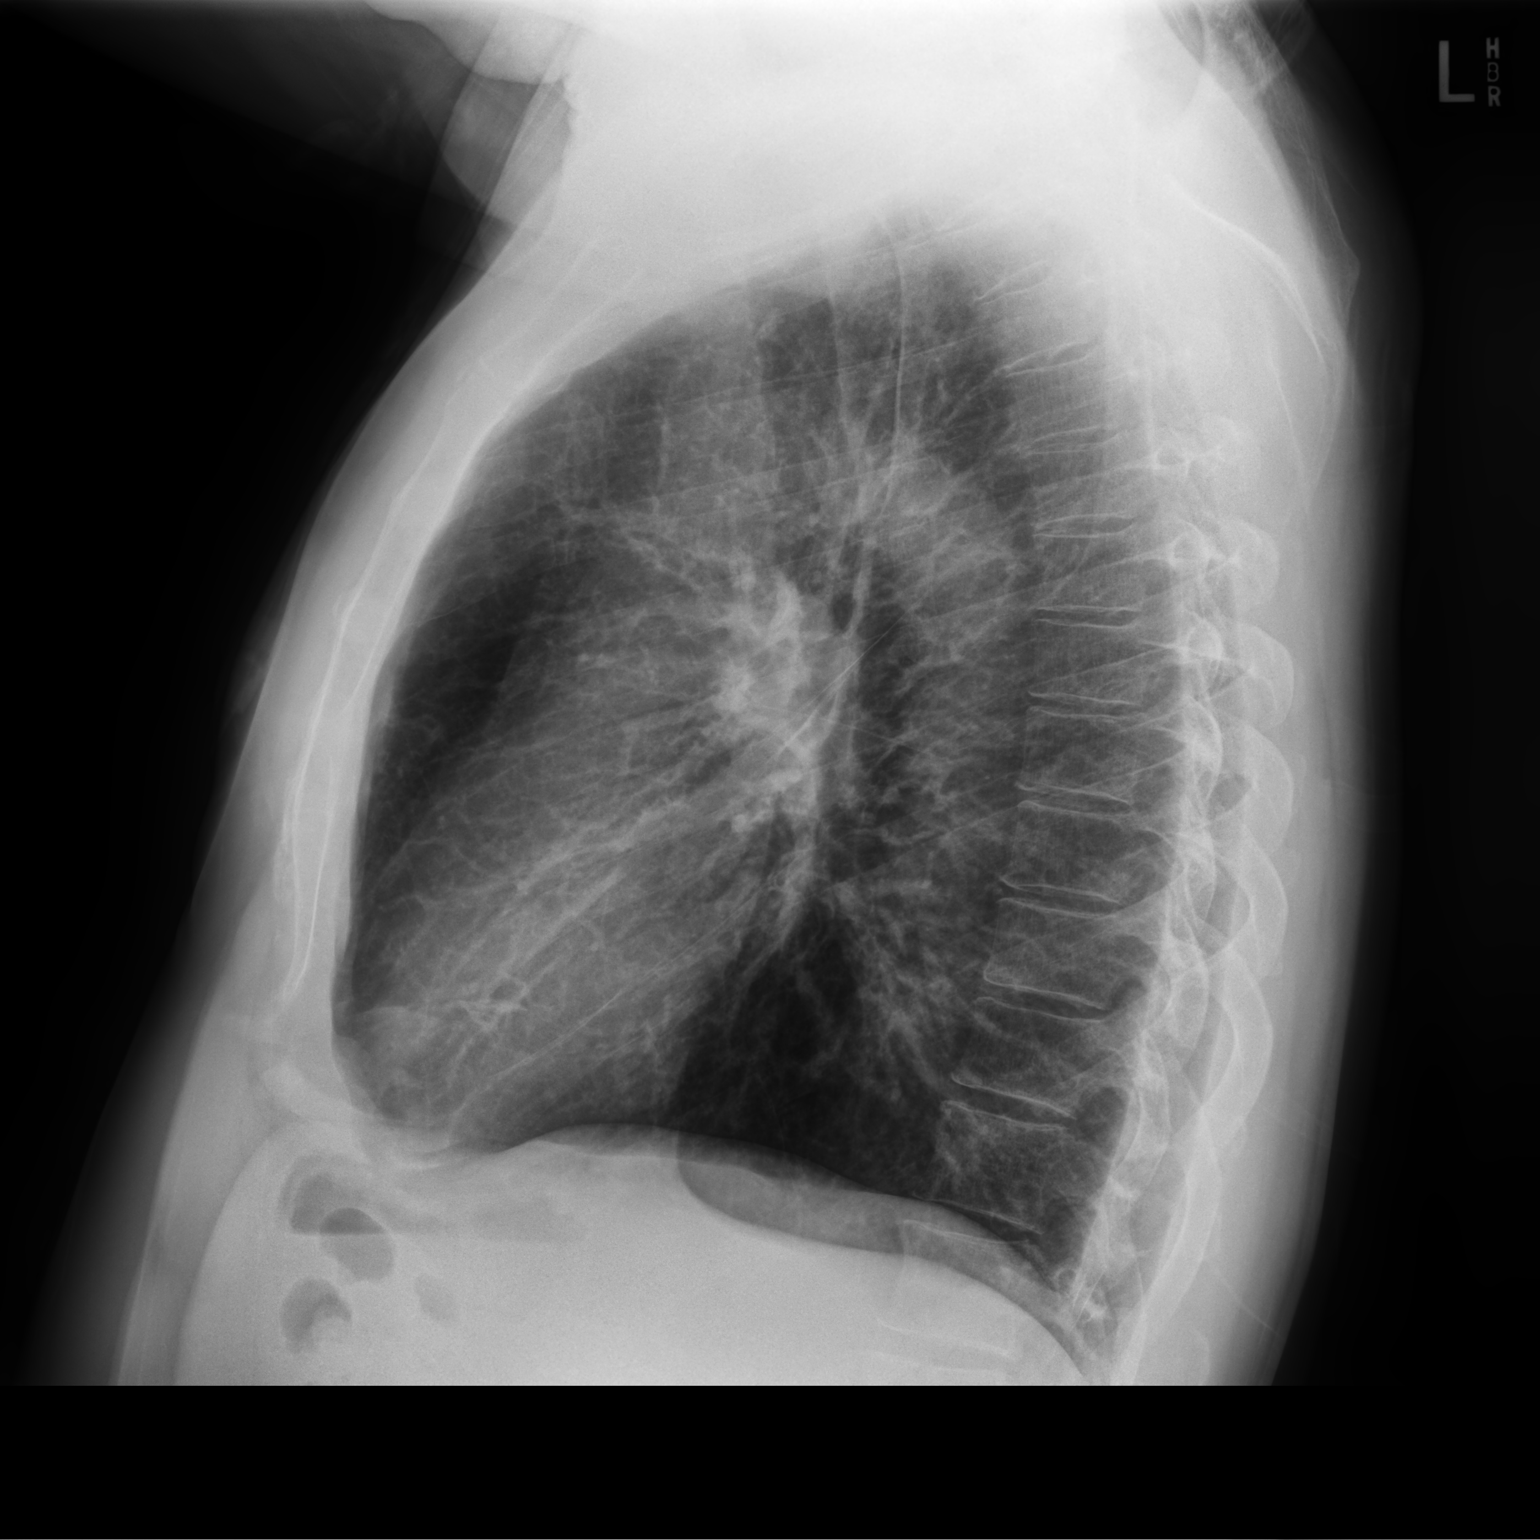

[2 of 2 positions shown; findings below may reference images not displayed]

FINDINGS: The heart size and pulmonary vascularity are normal and the lungs
are clear. No effusions. No bone abnormality.

Aortic atherosclerosis.
IMPRESSION: No acute disease.

## 2020-08-29 ENCOUNTER — Other Ambulatory Visit: Payer: Self-pay | Admitting: Pulmonary Disease

## 2021-01-10 ENCOUNTER — Other Ambulatory Visit: Payer: Self-pay | Admitting: Pulmonary Disease

## 2022-11-07 DEATH — deceased
